# Patient Record
Sex: Female | Born: 1995 | Race: White | Hispanic: No | Marital: Single | Smoking: Never smoker
Health system: Southern US, Community
[De-identification: ages and names within clinical notes are randomized; demographics above are authoritative.]

## PROBLEM LIST (undated history)

## (undated) ENCOUNTER — Ambulatory Visit: Admission: EM | Disposition: A | Discharge status: Home / Self Care | Payer: Managed Care, Other (non HMO)

## (undated) DIAGNOSIS — M24859 Other specific joint derangements of unspecified hip, not elsewhere classified: Secondary | ICD-10-CM

## (undated) DIAGNOSIS — J45909 Unspecified asthma, uncomplicated: Secondary | ICD-10-CM

## (undated) HISTORY — PX: KNEE SURGERY: SHX244

---
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
## 2017-04-02 ENCOUNTER — Ambulatory Visit
Admission: EM | Admit: 2017-04-02 | Discharge: 2017-04-02 | Disposition: A | Discharge status: Home / Self Care | Payer: BLUE CROSS/BLUE SHIELD | Attending: Family Medicine | Admitting: Family Medicine

## 2017-04-02 ENCOUNTER — Encounter: Payer: Self-pay | Admitting: *Deleted

## 2017-04-02 DIAGNOSIS — N390 Urinary tract infection, site not specified: Secondary | ICD-10-CM | POA: Diagnosis not present

## 2017-04-02 HISTORY — DX: Other specific joint derangements of unspecified hip, not elsewhere classified: M24.859

## 2017-04-02 LAB — URINALYSIS, COMPLETE (UACMP) WITH MICROSCOPIC
Bilirubin Urine: NEGATIVE
Glucose, UA: NEGATIVE mg/dL
KETONES UR: NEGATIVE mg/dL
LEUKOCYTES UA: NEGATIVE
Nitrite: NEGATIVE
PROTEIN: NEGATIVE mg/dL
Specific Gravity, Urine: 1.025 (ref 1.005–1.030)
pH: 7 (ref 5.0–8.0)

## 2017-04-02 LAB — PREGNANCY, URINE: Preg Test, Ur: NEGATIVE

## 2017-04-02 MED ORDER — SULFAMETHOXAZOLE-TRIMETHOPRIM 800-160 MG PO TABS: 1.0000 | ORAL_TABLET | Freq: Two times a day (BID) | ORAL | 0 refills | Status: DC

## 2017-04-02 NOTE — ED Triage Notes (Signed)
Patient started having symptoms of nausea, dizziness, body aches and SOB 2 days ago. Patient was given a Cortizone shot in her left kneed 1 week ago.

## 2017-04-02 NOTE — ED Provider Notes (Signed)
[facility]    CSN: [id number] Arrival date & time: 04/02/17  1353     History   Chief Complaint Chief Complaint  Patient presents with  . Dizziness  . Nausea  . Generalized Body Aches    HPI Anna Fox is a 21 y.o. female.   21 yo female with a c/o body aches, nausea, lightheadedness and decreased energy for the past 2-3 days. Denies any fevers, chills, chest pains, cough, congestion, vomiting, diarrhea, dysuria, hematuria.    The history is provided by the patient.  Dizziness    Past Medical History:  Diagnosis Date  . Asthma   . Snapping hip syndrome     There are no active problems to display for this patient.   Past Surgical History:  Procedure Laterality Date  . KNEE SURGERY      OB History    No data available       Home Medications    Prior to Admission medications   Medication Sig Start Date End Date Taking? Authorizing Provider  albuterol (PROVENTIL) (2.5 MG/3ML) 0.083% nebulizer solution Take 2.5 mg by nebulization every 6 (six) hours as needed for wheezing or shortness of breath.   Yes [provider]  norethindrone-ethinyl estradiol-iron (ESTROSTEP FE,TILIA FE,TRI-LEGEST FE) 1-20/1-30/1-35 MG-MCG tablet Take 1 tablet by mouth daily.   Yes [provider]  sulfamethoxazole-trimethoprim (BACTRIM DS,SEPTRA DS) 800-160 MG tablet Take 1 tablet by mouth 2 (two) times daily. 04/02/17   Payton Mccallumonty, Christasia Angeletti, MD    Family History History reviewed. No pertinent family history.  Social History Social History  Substance Use Topics  . Smoking status: Never Smoker  . Smokeless tobacco: Never Used  . Alcohol use No     Allergies   Patient has no known allergies.   Review of Systems Review of Systems  Neurological: Positive for dizziness.     Physical Exam Triage Vital Signs ED Triage Vitals  Enc Vitals Group     BP 04/02/17 1446 127/85     Pulse Rate 04/02/17 1446 93     Resp 04/02/17 1446 16     Temp  04/02/17 1446 99.1 F (37.3 C)     Temp Source 04/02/17 1446 Oral     SpO2 04/02/17 1446 100 %     Weight 04/02/17 1449 162 lb (73.5 kg)     Height 04/02/17 1449 5\' 6"  (1.676 m)     Head Circumference --      Peak Flow --      Pain Score 04/02/17 1449 0     Pain Loc --      Pain Edu? --      Excl. in GC? --    No data found.   Updated Vital Signs BP 127/85 (BP Location: Left Arm)   Pulse 93   Temp 99.1 F (37.3 C) (Oral)   Resp 16   Ht 5\' 6"  (1.676 m)   Wt 162 lb (73.5 kg)   LMP 03/31/2017   SpO2 100%   BMI 26.15 kg/m   Visual Acuity Right Eye Distance:   Left Eye Distance:   Bilateral Distance:    Right Eye Near:   Left Eye Near:    Bilateral Near:     Physical Exam  Constitutional: She appears well-developed and well-nourished. No distress.  HENT:  Head: Normocephalic and atraumatic.  Right Ear: Tympanic membrane, external ear and ear canal normal.  Left Ear: Tympanic membrane, external ear and ear canal normal.  Nose: No  mucosal edema, nose lacerations, sinus tenderness, nasal deformity, septal deviation or nasal septal hematoma. No epistaxis.  No foreign bodies.  Mouth/Throat: Uvula is midline, oropharynx is clear and moist and mucous membranes are normal. No oropharyngeal exudate.  Eyes: Pupils are equal, round, and reactive to light. Conjunctivae and EOM are normal. Right eye exhibits no discharge. Left eye exhibits no discharge. No scleral icterus.  Neck: Normal range of motion. Neck supple. No thyromegaly present.  Cardiovascular: Normal rate, regular rhythm and normal heart sounds.   Pulmonary/Chest: Effort normal and breath sounds normal. No respiratory distress. She has no wheezes. She has no rales.  Abdominal: Soft. Bowel sounds are normal. She exhibits no distension and no mass. There is tenderness (mild, suprapubic). There is no rebound and no guarding. No hernia.  Lymphadenopathy:    She has no cervical adenopathy.  Skin: She is not diaphoretic.    Nursing note and vitals reviewed.    UC Treatments / Results  Labs (all labs ordered are listed, but only abnormal results are displayed) Labs Reviewed  URINALYSIS, COMPLETE (UACMP) WITH MICROSCOPIC - Abnormal; Notable for the following:       Result Value   Hgb urine dipstick TRACE (*)    Squamous Epithelial / LPF 0-5 (*)    Bacteria, UA RARE (*)    All other components within normal limits  PREGNANCY, URINE    EKG  EKG Interpretation None       Radiology No results found.  Procedures Procedures (including critical care time)  Medications Ordered in UC Medications - No data to display   Initial Impression / Assessment and Plan / UC Course  I have reviewed the triage vital signs and the nursing notes.  Pertinent labs & imaging results that were available during my care of the patient were reviewed by me and considered in my medical decision making (see chart for details).      Final Clinical Impressions(s) / UC Diagnoses   Final diagnoses:  Urinary tract infection without hematuria, site unspecified    New Prescriptions Discharge Medication List as of 04/02/2017  4:23 PM    START taking these medications   Details  sulfamethoxazole-trimethoprim (BACTRIM DS,SEPTRA DS) 800-160 MG tablet Take 1 tablet by mouth 2 (two) times daily., Starting Wed 04/02/2017, Normal       1. Lab results and diagnosis reviewed with patient 2. rx as per orders above; reviewed possible side effects, interactions, risks and benefits  3. Recommend supportive treatment with increased fluids 4. Follow-up prn if symptoms worsen or don't improve Controlled Substance Prescriptions [place] Controlled Substance Registry consulted? Not Applicable   Payton Mccallum, MD 04/02/17 1640

## 2017-06-16 ENCOUNTER — Emergency Department (HOSPITAL_COMMUNITY): Payer: BLUE CROSS/BLUE SHIELD

## 2017-06-16 ENCOUNTER — Other Ambulatory Visit: Payer: Self-pay

## 2017-06-16 ENCOUNTER — Emergency Department (HOSPITAL_COMMUNITY)
Admission: EM | Admit: 2017-06-16 | Discharge: 2017-06-16 | Disposition: A | Discharge status: Home / Self Care | Payer: BLUE CROSS/BLUE SHIELD | Attending: Emergency Medicine | Admitting: Emergency Medicine

## 2017-06-16 ENCOUNTER — Encounter (HOSPITAL_COMMUNITY): Payer: Self-pay

## 2017-06-16 DIAGNOSIS — J45901 Unspecified asthma with (acute) exacerbation: Secondary | ICD-10-CM | POA: Diagnosis not present

## 2017-06-16 DIAGNOSIS — Z79899 Other long term (current) drug therapy: Secondary | ICD-10-CM | POA: Diagnosis not present

## 2017-06-16 DIAGNOSIS — R0602 Shortness of breath: Secondary | ICD-10-CM | POA: Diagnosis present

## 2017-06-16 MED ORDER — ALBUTEROL SULFATE (2.5 MG/3ML) 0.083% IN NEBU
5.0000 mg | INHALATION_SOLUTION | Freq: Once | RESPIRATORY_TRACT | Status: AC
  Administered 2017-06-16: 5 mg via RESPIRATORY_TRACT
  Filled 2017-06-16: qty 6

## 2017-06-16 MED ORDER — ALBUTEROL (5 MG/ML) CONTINUOUS INHALATION SOLN
15.0000 mg/h | INHALATION_SOLUTION | RESPIRATORY_TRACT | Status: DC
  Administered 2017-06-16: 15 mg/h via RESPIRATORY_TRACT
  Filled 2017-06-16: qty 3

## 2017-06-16 MED ORDER — PREDNISONE 20 MG PO TABS: ORAL_TABLET | ORAL | 0 refills | Status: DC

## 2017-06-16 MED ORDER — METHYLPREDNISOLONE SODIUM SUCC 125 MG IJ SOLR
125.0000 mg | Freq: Once | INTRAMUSCULAR | Status: AC
  Administered 2017-06-16: 125 mg via INTRAMUSCULAR
  Filled 2017-06-16: qty 2

## 2017-06-16 NOTE — ED Notes (Signed)
Patient signed discharge paperwork, will continue to monitor patient HR until it lowers

## 2017-06-16 NOTE — ED Triage Notes (Signed)
Per Pt, Pt is coming from home with complaints of asthma attack. Wheezing noted.

## 2017-06-16 NOTE — ED Notes (Signed)
Pt states chest still feels tight, though sob improved.

## 2017-06-16 NOTE — ED Provider Notes (Signed)
[facility]   CSN: [id number] Arrival date & time: 06/16/17  1108     History   Chief Complaint Chief Complaint  Patient presents with  . Asthma    HPI Anna Fox is a 21 y.o. female.  HPI Patient with history of asthma presents with several days of gradually increasing shortness of breath.  Associated with wheezing and nonproductive cough.  Has been taking medications as prescribed with little relief.  Denies any fever or chills.  No chest pain.  No new lower extremity swelling or pain. Past Medical History:  Diagnosis Date  . Asthma   . Snapping hip syndrome     There are no active problems to display for this patient.   Past Surgical History:  Procedure Laterality Date  . KNEE SURGERY      OB History    No data available       Home Medications    Prior to Admission medications   Medication Sig Start Date End Date Taking? Authorizing Provider  albuterol (PROAIR HFA) 108 (90 Base) MCG/ACT inhaler Inhale 2 puffs into the lungs every 6 (six) hours as needed for wheezing. 01/09/17  Yes [provider]  beclomethasone (QVAR) 40 MCG/ACT inhaler Inhale 2 puffs into the lungs 2 (two) times daily. 04/15/17  Yes [provider]  fluticasone (FLONASE) 50 MCG/ACT nasal spray Place 2 sprays into the nose daily as needed for allergies. 04/28/17 04/28/18 Yes [provider]  Norethindrone Acetate-Ethinyl Estradiol (JUNEL 1.5/30) 1.5-30 MG-MCG tablet Take 1 tablet by mouth daily. 01/09/17  Yes [provider]  norethindrone-ethinyl estradiol-iron (ESTROSTEP FE,TILIA FE,TRI-LEGEST FE) 1-20/1-30/1-35 MG-MCG tablet Take 1 tablet by mouth daily.   Yes [provider]  predniSONE (DELTASONE) 20 MG tablet 3 tabs po day one, then 2 po daily x 4 days 06/17/17   Loren RacerYelverton, Dorena Dorfman, MD  sulfamethoxazole-trimethoprim (BACTRIM DS,SEPTRA DS) 800-160 MG tablet Take 1 tablet by mouth 2 (two) times  daily. Patient not taking: Reported on 06/16/2017 04/02/17   Payton Mccallumonty, [place], MD    Family History No family history on file.  Social History Social History   Tobacco Use  . Smoking status: Never Smoker  . Smokeless tobacco: Never Used  Substance Use Topics  . Alcohol use: No  . Drug use: No     Allergies   Patient has no known allergies.   Review of Systems Review of Systems  Constitutional: Negative for chills and fever.  HENT: Negative for congestion, sinus pressure, sinus pain and sore throat.   Respiratory: Positive for cough, shortness of breath and wheezing.   Cardiovascular: Negative for chest pain, palpitations and leg swelling.  Gastrointestinal: Negative for abdominal pain, diarrhea, nausea and vomiting.  Musculoskeletal: Negative for back pain, myalgias, neck pain and neck stiffness.  Skin: Negative for rash and wound.  Neurological: Negative for dizziness, weakness, light-headedness, numbness and headaches.  All other systems reviewed and are negative.    Physical Exam Updated Vital Signs BP 121/66   Pulse (!) 116   Temp 98.1 F (36.7 C) (Oral)   Resp 11   LMP 06/16/2017   SpO2 98%   Physical Exam  Constitutional: She is oriented to person, place, and time. She appears well-developed and well-nourished. No distress.  HENT:  Head: Normocephalic and atraumatic.  Mouth/Throat: Oropharynx is clear and moist.  Eyes: EOM are normal. Pupils are equal, round, and reactive to light.  Neck: Normal range of motion. Neck supple.  Cardiovascular:  Normal rate and regular rhythm.  Pulmonary/Chest: Effort normal. She has wheezes.  Decreased air movement.  Inspiratory and expiratory wheezing throughout.  Abdominal: Soft. Bowel sounds are normal. There is no tenderness. There is no rebound and no guarding.  Musculoskeletal: Normal range of motion. She exhibits no edema or tenderness.  No lower extremity swelling or asymmetry.   Neurological: She is alert and  oriented to person, place, and time.  Moves all extremities without deficit.  Sensation fully intact.  Skin: Skin is warm and dry. Capillary refill takes less than 2 seconds. No rash noted. She is not diaphoretic. No erythema.  Psychiatric: She has a normal mood and affect. Her behavior is normal.  Nursing note and vitals reviewed.    ED Treatments / Results  Labs (all labs ordered are listed, but only abnormal results are displayed) Labs Reviewed - No data to display  EKG  EKG Interpretation None       Radiology Dg Chest 2 View  Result Date: 06/16/2017 CLINICAL DATA:  Shortness of breath. EXAM: CHEST  2 VIEW COMPARISON:  Chest x-ray dated January 11, 2015. FINDINGS: The cardiomediastinal silhouette is normal in size. Normal pulmonary vascularity. No focal consolidation, pleural effusion, or pneumothorax. No acute osseous abnormality. IMPRESSION: No active cardiopulmonary disease. Electronically Signed   By: Obie DredgeWilliam T Derry M.D.   On: 06/16/2017 12:09    Procedures Procedures (including critical care time)  Medications Ordered in ED Medications  albuterol (PROVENTIL,VENTOLIN) solution continuous neb (15 mg/hr Nebulization New Bag/Given 06/16/17 1622)  albuterol (PROVENTIL) (2.5 MG/3ML) 0.083% nebulizer solution 5 mg (5 mg Nebulization Given 06/16/17 1145)  methylPREDNISolone sodium succinate (SOLU-MEDROL) 125 mg/2 mL injection 125 mg (125 mg Intramuscular Given 06/16/17 1615)     Initial Impression / Assessment and Plan / ED Course  I have reviewed the triage vital signs and the nursing notes.  Pertinent labs & imaging results that were available during my care of the patient were reviewed by me and considered in my medical decision making (see chart for details).    Wheezing significantly improved after continuous neb.  Patient had brief tachycardia which is improved.  States she is feeling much better.  Will discharge home with short course of prednisone and follow-up with  primary doctor.  Return precautions given.   Final Clinical Impressions(s) / ED Diagnoses   Final diagnoses:  Exacerbation of asthma, unspecified asthma severity, unspecified whether persistent    ED Discharge Orders        Ordered    predniSONE (DELTASONE) 20 MG tablet     06/16/17 Charlene Brooke1838       Kashawn Manzano, MD 06/16/17 1839

## 2017-09-24 ENCOUNTER — Ambulatory Visit (HOSPITAL_COMMUNITY)
Admission: EM | Admit: 2017-09-24 | Discharge: 2017-09-24 | Disposition: A | Discharge status: Home / Self Care | Payer: BLUE CROSS/BLUE SHIELD | Attending: Family Medicine | Admitting: Family Medicine

## 2017-09-24 ENCOUNTER — Encounter (HOSPITAL_COMMUNITY): Payer: Self-pay | Admitting: Family Medicine

## 2017-09-24 DIAGNOSIS — J020 Streptococcal pharyngitis: Secondary | ICD-10-CM

## 2017-09-24 MED ORDER — ACETAMINOPHEN 325 MG PO TABS
650.0000 mg | ORAL_TABLET | Freq: Once | ORAL | Status: AC
  Administered 2017-09-24: 650 mg via ORAL

## 2017-09-24 MED ORDER — ACETAMINOPHEN 325 MG PO TABS
ORAL_TABLET | ORAL | Status: AC
  Filled 2017-09-24: qty 2

## 2017-09-24 MED ORDER — AMOXICILLIN 875 MG PO TABS: 875.0000 mg | ORAL_TABLET | Freq: Two times a day (BID) | ORAL | 0 refills | Status: AC

## 2017-09-24 NOTE — ED Triage Notes (Signed)
Pt here with sore throat, fever and body aches

## 2017-09-25 NOTE — ED Provider Notes (Signed)
  [facility]   [id number] 09/24/17 Arrival Time: 1951  ASSESSMENT & PLAN:  1. Streptococcal sore throat    Meds ordered this encounter  Medications  . acetaminophen (TYLENOL) tablet 650 mg  . amoxicillin (AMOXIL) 875 MG tablet    Sig: Take 1 tablet (875 mg total) by mouth 2 (two) times daily for 10 days.    Dispense:  20 tablet    Refill:  0   OTC analgesics and throat care as needed  Instructed to finish full 10 day course of antibiotics. Will follow up if not showing significant improvement over the next 24-48 hours.  Reviewed expectations re: course of current medical issues. Questions answered. Outlined signs and symptoms indicating need for more acute intervention. Patient verbalized understanding. After Visit Summary given.   SUBJECTIVE:  Anna Fox is a 22 y.o. female who reports a sore throat. Describes as pain with swallowing. Onset abrupt beginning 1 day ago. No respiratory symptoms. Normal PO intake but reports discomfort with swallowing. Fever reported: yes. No associated n/v/abdominal symptoms. Sick contacts: none.  No neck pain or swelling. No rashes.  OTC treatment: Tylenol with mild help.  ROS: As per HPI.   OBJECTIVE:  Vitals:   09/24/17 2008  BP: 115/61  Pulse: (!) 141  Resp: 18  Temp: (!) 103.1 F (39.5 C)  TempSrc: Oral  SpO2: 98%     General appearance: alert; no distress HEENT: throat with tonsillar hypertrophy, marked erythema and exudates present; uvula midline Neck: supple with FROM; bilateral cervical LAD, tender Lungs: clear to auscultation bilaterally Skin: reveals no rash; warm and dry Psychological: alert and cooperative; normal mood and affect  No Known Allergies  Past Medical History:  Diagnosis Date  . Asthma   . Snapping hip syndrome    Social History   Socioeconomic History  . Marital status: Single    Spouse name: Not on file  . Number of children: Not on file  . Years of education: Not on file  .  Highest education level: Not on file  Social Needs  . Financial resource strain: Not on file  . Food insecurity - worry: Not on file  . Food insecurity - inability: Not on file  . Transportation needs - medical: Not on file  . Transportation needs - non-medical: Not on file  Occupational History  . Not on file  Tobacco Use  . Smoking status: Never Smoker  . Smokeless tobacco: Never Used  Substance and Sexual Activity  . Alcohol use: No  . Drug use: No  . Sexual activity: Not on file  Other Topics Concern  . Not on file  Social History Narrative  . Not on file          Mardella LaymanHagler, Doreene Forrey, MD 09/25/17 [phone]

## 2018-04-14 ENCOUNTER — Observation Stay (HOSPITAL_COMMUNITY)
Admission: EM | Admit: 2018-04-14 | Discharge: 2018-04-16 | Disposition: A | Discharge status: Home / Self Care | Payer: BLUE CROSS/BLUE SHIELD | Attending: Internal Medicine | Admitting: Internal Medicine

## 2018-04-14 ENCOUNTER — Encounter (HOSPITAL_COMMUNITY): Payer: Self-pay | Admitting: Emergency Medicine

## 2018-04-14 ENCOUNTER — Other Ambulatory Visit: Payer: Self-pay

## 2018-04-14 DIAGNOSIS — R269 Unspecified abnormalities of gait and mobility: Secondary | ICD-10-CM | POA: Diagnosis present

## 2018-04-14 DIAGNOSIS — M542 Cervicalgia: Secondary | ICD-10-CM | POA: Diagnosis present

## 2018-04-14 DIAGNOSIS — J4540 Moderate persistent asthma, uncomplicated: Secondary | ICD-10-CM | POA: Diagnosis present

## 2018-04-14 DIAGNOSIS — R482 Apraxia: Principal | ICD-10-CM

## 2018-04-14 DIAGNOSIS — R2689 Other abnormalities of gait and mobility: Secondary | ICD-10-CM | POA: Diagnosis not present

## 2018-04-14 DIAGNOSIS — M24859 Other specific joint derangements of unspecified hip, not elsewhere classified: Secondary | ICD-10-CM | POA: Insufficient documentation

## 2018-04-14 DIAGNOSIS — T1490XA Injury, unspecified, initial encounter: Secondary | ICD-10-CM

## 2018-04-14 DIAGNOSIS — Z79899 Other long term (current) drug therapy: Secondary | ICD-10-CM | POA: Diagnosis not present

## 2018-04-14 DIAGNOSIS — F444 Conversion disorder with motor symptom or deficit: Secondary | ICD-10-CM

## 2018-04-14 NOTE — ED Notes (Signed)
Pt back in WR

## 2018-04-14 NOTE — ED Triage Notes (Signed)
Pt reports left neck pain that started today at softball practice. Pt reports she was pitching and jerked her head back, heard a click and now it hurts to move.
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
## 2018-04-15 ENCOUNTER — Observation Stay (HOSPITAL_COMMUNITY): Payer: BLUE CROSS/BLUE SHIELD

## 2018-04-15 ENCOUNTER — Other Ambulatory Visit: Payer: Self-pay

## 2018-04-15 ENCOUNTER — Emergency Department (HOSPITAL_COMMUNITY): Payer: BLUE CROSS/BLUE SHIELD

## 2018-04-15 ENCOUNTER — Encounter (HOSPITAL_COMMUNITY): Payer: Self-pay | Admitting: Internal Medicine

## 2018-04-15 DIAGNOSIS — R270 Ataxia, unspecified: Secondary | ICD-10-CM | POA: Diagnosis not present

## 2018-04-15 DIAGNOSIS — M542 Cervicalgia: Secondary | ICD-10-CM | POA: Diagnosis present

## 2018-04-15 DIAGNOSIS — J4540 Moderate persistent asthma, uncomplicated: Secondary | ICD-10-CM | POA: Diagnosis present

## 2018-04-15 DIAGNOSIS — R260 Ataxic gait: Secondary | ICD-10-CM

## 2018-04-15 LAB — COMPREHENSIVE METABOLIC PANEL
ALBUMIN: 4.3 g/dL (ref 3.5–5.0)
ALT: 29 U/L (ref 0–44)
AST: 27 U/L (ref 15–41)
Alkaline Phosphatase: 70 U/L (ref 38–126)
Anion gap: 9 (ref 5–15)
BUN: 16 mg/dL (ref 6–20)
CHLORIDE: 107 mmol/L (ref 98–111)
CO2: 25 mmol/L (ref 22–32)
Calcium: 9.2 mg/dL (ref 8.9–10.3)
Creatinine, Ser: 0.97 mg/dL (ref 0.44–1.00)
GFR calc Af Amer: >60 mL/min (ref >60)
GFR calc non Af Amer: >60 mL/min (ref >60)
Glucose, Bld: 97 mg/dL (ref 70–99)
POTASSIUM: 3.2 mmol/L — AB (ref 3.5–5.1)
SODIUM: 141 mmol/L (ref 135–145)
Total Bilirubin: 0.6 mg/dL (ref 0.3–1.2)
Total Protein: 7.1 g/dL (ref 6.5–8.1)

## 2018-04-15 LAB — DIFFERENTIAL
ABS IMMATURE GRANULOCYTES: 0 10*3/uL (ref 0.0–0.1)
BASOS ABS: 0 10*3/uL (ref 0.0–0.1)
BASOS PCT: 0 %
Eosinophils Absolute: 0.3 10*3/uL (ref 0.0–0.7)
Eosinophils Relative: 3 %
IMMATURE GRANULOCYTES: 0 %
Lymphocytes Relative: 42 %
Lymphs Abs: 3.6 10*3/uL (ref 0.7–4.0)
Monocytes Absolute: 0.6 10*3/uL (ref 0.1–1.0)
Monocytes Relative: 7 %
NEUTROS ABS: 4.1 10*3/uL (ref 1.7–7.7)
Neutrophils Relative %: 48 %

## 2018-04-15 LAB — CBC
HCT: 40.4 % (ref 36.0–46.0)
HEMOGLOBIN: 13.1 g/dL (ref 12.0–15.0)
MCH: 28.2 pg (ref 26.0–34.0)
MCHC: 32.4 g/dL (ref 30.0–36.0)
MCV: 87.1 fL (ref 78.0–100.0)
PLATELETS: 274 10*3/uL (ref 150–400)
RBC: 4.64 MIL/uL (ref 3.87–5.11)
RDW: 12.6 % (ref 11.5–15.5)
WBC: 8.5 10*3/uL (ref 4.0–10.5)

## 2018-04-15 LAB — RAPID URINE DRUG SCREEN, HOSP PERFORMED
Amphetamines: NOT DETECTED
BENZODIAZEPINES: NOT DETECTED
Barbiturates: NOT DETECTED
Cocaine: NOT DETECTED
Opiates: NOT DETECTED
Tetrahydrocannabinol: NOT DETECTED

## 2018-04-15 LAB — APTT: APTT: 30 s (ref 24–36)

## 2018-04-15 LAB — PROTIME-INR
INR: 1
Prothrombin Time: 13.1 seconds (ref 11.4–15.2)

## 2018-04-15 MED ORDER — GADOBUTROL 1 MMOL/ML IV SOLN
7.5000 mL | Freq: Once | INTRAVENOUS | Status: AC | PRN
  Administered 2018-04-15: 7 mL via INTRAVENOUS

## 2018-04-15 MED ORDER — ACETAMINOPHEN 650 MG RE SUPP: 650.0000 mg | Freq: Four times a day (QID) | RECTAL | Status: DC | PRN

## 2018-04-15 MED ORDER — ACETAMINOPHEN 325 MG PO TABS: 650.0000 mg | ORAL_TABLET | Freq: Four times a day (QID) | ORAL | Status: DC | PRN

## 2018-04-15 MED ORDER — ONDANSETRON HCL 4 MG PO TABS: 4.0000 mg | ORAL_TABLET | Freq: Four times a day (QID) | ORAL | Status: DC | PRN

## 2018-04-15 MED ORDER — IOPAMIDOL (ISOVUE-370) INJECTION 76%
INTRAVENOUS | Status: AC
  Filled 2018-04-15: qty 50

## 2018-04-15 MED ORDER — METHOCARBAMOL 500 MG PO TABS: 1000.0000 mg | ORAL_TABLET | Freq: Four times a day (QID) | ORAL | Status: DC | PRN

## 2018-04-15 MED ORDER — ALBUTEROL SULFATE (2.5 MG/3ML) 0.083% IN NEBU: 2.5000 mg | INHALATION_SOLUTION | Freq: Four times a day (QID) | RESPIRATORY_TRACT | Status: DC | PRN

## 2018-04-15 MED ORDER — IOPAMIDOL (ISOVUE-370) INJECTION 76%
50.0000 mL | Freq: Once | INTRAVENOUS | Status: AC | PRN
  Administered 2018-04-15: 50 mL via INTRAVENOUS

## 2018-04-15 MED ORDER — MOMETASONE FURO-FORMOTEROL FUM 200-5 MCG/ACT IN AERO
2.0000 | INHALATION_SPRAY | Freq: Two times a day (BID) | RESPIRATORY_TRACT | Status: DC
  Administered 2018-04-15 – 2018-04-16 (×2): 2 via RESPIRATORY_TRACT
  Filled 2018-04-15: qty 8.8

## 2018-04-15 MED ORDER — NORETHINDRONE ACET-ETHINYL EST 1.5-30 MG-MCG PO TABS: 1.0000 | ORAL_TABLET | Freq: Every day | ORAL | Status: DC

## 2018-04-15 MED ORDER — ONDANSETRON HCL 4 MG/2ML IJ SOLN: 4.0000 mg | Freq: Four times a day (QID) | INTRAMUSCULAR | Status: DC | PRN

## 2018-04-15 NOTE — Progress Notes (Signed)
Patient sleeping with no distress, respirations unlabored , family  with pt.

## 2018-04-15 NOTE — Progress Notes (Signed)
Pt requesting update. Dr. Ophelia Charter contacted and she states pt to be observed overnight and potential discharge in am. Same reported to patient.Pt states pain 5/10 at neck but declines pain medication.father and brother at bedside

## 2018-04-15 NOTE — ED Notes (Signed)
Patient transported to CT 

## 2018-04-15 NOTE — ED Notes (Signed)
Family at bedside. 

## 2018-04-15 NOTE — Consult Note (Addendum)
Neurology Consultation  Reason for Consult: Left neck pain, gait ataxia following pitching injury Referring Physician: Dr. Manus Gunning  CC: Neck pain, gait ataxia  History is obtained from: Patient, chart  HPI: Anna Fox is a 22 y.o. female with past medical history of asthma, who was in her usual state of health till about 4 PM on 04/14/2018 when she was at her school softball practice and threw a fast pitch following which her head flipped back in a swinging motion and she heard a crack in her neck.  She was able to walk back to the dugout and noticed after that that she has extreme difficulty moving her neck looking to the left as well as upon attempting to walk, was very unsteady and unstable. She denies any lightheadedness or dizziness at rest but on attempting to walk, she feels dizzy which she describes as unstable and attempting to walk. Reports no focal tingling, numbness or weakness. Denies any similar episodes in the past. Due to the abnormal neurological exam-ataxia on attempting to walk, and neck pain, that started after neck injury, concern for vertebral artery dissection, neurological consultation was placed. I saw the patient in the emergency room fast-track room and have ordered a stat CT Angie of head and neck.  LKW: 4 PM on 04/14/2018 tpa given?: no, outside the window Premorbid modified Rankin scale (mRS):0  ROS: ROS was performed and is negative except as noted in the HPI.  Past Medical History:  Diagnosis Date  . Asthma   . Snapping hip syndrome     No family history on file.  Social History:   reports that she has never smoked. She has never used smokeless tobacco. She reports that she does not drink alcohol or use drugs.  Medications No current facility-administered medications for this encounter.   Current Outpatient Medications:  .  albuterol (PROAIR HFA) 108 (90 Base) MCG/ACT inhaler, Inhale 2 puffs into the lungs every 6 (six) hours as needed for  wheezing., Disp: , Rfl:  .  beclomethasone (QVAR) 40 MCG/ACT inhaler, Inhale 2 puffs into the lungs 2 (two) times daily., Disp: , Rfl:  .  fluticasone (FLONASE) 50 MCG/ACT nasal spray, Place 2 sprays into the nose daily as needed for allergies., Disp: , Rfl:  .  Norethindrone Acetate-Ethinyl Estradiol (JUNEL 1.5/30) 1.5-30 MG-MCG tablet, Take 1 tablet by mouth daily., Disp: , Rfl:  .  norethindrone-ethinyl estradiol-iron (ESTROSTEP FE,TILIA FE,TRI-LEGEST FE) 1-20/1-30/1-35 MG-MCG tablet, Take 1 tablet by mouth daily., Disp: , Rfl:  .  predniSONE (DELTASONE) 20 MG tablet, 3 tabs po day one, then 2 po daily x 4 days, Disp: 11 tablet, Rfl: 0 .  sulfamethoxazole-trimethoprim (BACTRIM DS,SEPTRA DS) 800-160 MG tablet, Take 1 tablet by mouth 2 (two) times daily. (Patient not taking: Reported on 06/16/2017), Disp: 10 tablet, Rfl: 0  Exam: Current vital signs: BP 129/83 (BP Location: Left Arm)   Pulse 84   Temp 98.2 F (36.8 C) (Oral)   Resp 16   Ht 5' 5.5" (1.664 m)   Wt 70.3 kg   LMP 04/13/2018   SpO2 100%   BMI 25.40 kg/m  Vital signs in last 24 hours: Temp:  [98.2 F (36.8 C)] 98.2 F (36.8 C) (09/24 2050) Pulse Rate:  [84] 84 (09/24 2050) Resp:  [16] 16 (09/24 2050) BP: (129)/(83) 129/83 (09/24 2050) SpO2:  [100 %] 100 % (09/24 2050) Weight:  [70.3 kg] 70.3 kg (09/24 2051) GENERAL: Awake, alert in NAD but anxious appearing HEENT: - Normocephalic and  atraumatic, dry mm, no LN++, no Thyromegaly. Palpable tenderness in the left neck. LUNGS - Clear to auscultation bilaterally with no wheezes CV - S1S2 RRR, no m/r/g, equal pulses bilaterally. ABDOMEN - Soft, nontender, nondistended with normoactive BS Ext: warm, well perfused, intact peripheral pulses, no edema  NEURO:  Mental Status: AA&Ox3 Language: speech is clear.  Naming, repetition, fluency, and comprehension intact. Cranial Nerves: PERRL_ EOMI, visual fields full, no facial asymmetry, facial sensation intact, hearing intact,  tongue/uvula/soft palate midline, normal sternocleidomastoid and trapezius muscle strength. No evidence of tongue atrophy or fibrillations Motor: 5/5 in all 4 extremities with no drift Tone: is normal and bulk is normal Sensation- Intact to light touch bilaterally Coordination: FTN intact bilaterally, no ataxia in BLE. Gait-upon getting up from the seated position, patient becomes wobbly as if her knee is about to buckle and is able to take a few steps again with wobbly legs although it rest, she had 5/5 strength in both hip flexors extensors as well as knee flexors and extensors.  NIHSS-0   Labs I have reviewed labs in epic and the results pertinent to this consultation are:  CBC    Component Value Date/Time   WBC 6.7 09/01/2016 1807   RBC 4.53 09/01/2016 1807   HGB 12.8 09/01/2016 1807   HCT 38.6 09/01/2016 1807   PLT 268 09/01/2016 1807   MCV 85.2 09/01/2016 1807   MCH 28.3 09/01/2016 1807   MCHC 33.2 09/01/2016 1807   RDW 13.1 09/01/2016 1807    CMP     Component Value Date/Time   NA 141 09/01/2016 1807   K 3.9 09/01/2016 1807   CL 107 09/01/2016 1807   CO2 28 09/01/2016 1807   GLUCOSE 95 09/01/2016 1807   BUN 11 09/01/2016 1807   CREATININE 0.76 09/01/2016 1807   CALCIUM 9.0 09/01/2016 1807   PROT 6.5 09/01/2016 1807   ALBUMIN 3.9 09/01/2016 1807   AST 21 09/01/2016 1807   ALT 18 09/01/2016 1807   ALKPHOS 64 09/01/2016 1807   BILITOT 0.3 09/01/2016 1807   GFRNONAA >60 09/01/2016 1807   GFRAA >60 09/01/2016 1807  U tox - negative  Imaging I have reviewed the images obtained: CTA head and neck-no dissection, no abnormality on CT head non con and no fracture on CT c-spine.  Assessment:  22/F with no significant PMH, with sudden onset of gait instability without any other evidence of ataxia on exam, which started after she threw a fast pitch and cracked her neck. CTH unremarkable for acute process including early sign of ischemic stroke or bleed. CTA head neck  negative for dissection. Unclear etiology of the gait abnormality. Would recommend MR imaging brain and C-spine to ensure that there is no acute stroke or evidence of c-spine or cervical cord trauma. In the absence of MRI abnormality, I do not have a good plausible explanation for the kind of gait abnormality that she is exhibiting.  Impression: Acute onset of gait unsteadiness after almost whiplash like mechanism injury to the neck No CT/CTA head neck abnormality Evaluate further with head and neck MRI  Recommendations: MRI brain and C-spine without contrast PT OT eval No other neurological testing needed if MRI is negative. Will follow after MRI is done.  -- Milon Dikes, MD [facility] Pager: [phone] If 7pm to 7am, please call on call as listed on [facility]ION.  ADDENDUM Pt completed MRI brain and C-spine. No acute abnormalities. No explanation for the symptoms. Will need PT OT.  -- Beatrix Fetters  Wilford Corner, MD [facility] Pager: [phone] If 7pm to 7am, please call on call as listed on AMION.

## 2018-04-15 NOTE — Progress Notes (Signed)
Pt states pain continues and is eating supper while watching TV with family stats she will take mediation now.

## 2018-04-15 NOTE — ED Notes (Signed)
Attempted to ambulate patient down hallway. However, due to Pt's obvious shakiness and jerky movements, this tech did not feel comfortable ambulating the patient out of the room. Pt stated she did not feel weak, "just shaky". RN notified.

## 2018-04-15 NOTE — Progress Notes (Addendum)
Subjective: Patient states that she is feeling better today.  That her gait is slightly more stable.  Friends in the room feels as though she is getting better.  Patient stating that her upper body has no problems but her lower body and walking seems to be difficult.  She admits that she has full strength.  She did describe left medial cervical neck pain that was exacerbated by palpation.  Of note: Patient made minimal eye contact during follow-up  Exam: Vitals:   04/15/18 0154 04/15/18 0621  BP: 137/83 132/77  Pulse: 68 60  Resp: 18 18  Temp:    SpO2: 99% 100%    Physical Exam   HEENT-  Normocephalic, no lesions, without obvious abnormality.  Normal external eye and conjunctiva.   Extremities- Warm, dry and intact Musculoskeletal-no joint tenderness, deformity or swelling Skin-warm and dry, no hyperpigmentation, vitiligo, or suspicious lesions    Neuro:  Mental Status: Alert, oriented, thought content appropriate.  Speech fluent without evidence of aphasia.  Able to follow 3 step commands without difficulty.  Had decreased eye contact during the interview.   Cranial Nerves: II:  Visual fields grossly normal,  III,IV, VI: ptosis not present, extra-ocular motions intact bilaterally pupils equal, round, reactive to light and accommodation V,VII: smile symmetric, facial light touch sensation normal bilaterally VIII: hearing normal bilaterally IX,X: uvula rises midline XI: bilateral shoulder shrug XII: midline tongue extension Motor: Right : Upper extremity   5/5    Left:     Upper extremity   5/5  Lower extremity   5/5     Lower extremity   5/5 Tone and bulk:normal tone throughout; no atrophy noted Sensory: Pinprick and light touch intact throughout, bilaterally, proprioception, vibratory sensation temperature sensation and touch were all intact Deep Tendon Reflexes: 2+ and symmetric throughout Plantars: Right: downgoing   Left: downgoing Cerebellar: normal finger-to-nose,and  normal heel-to-shin test Gait: Patient was able to stand on her own, was able to bend her knees and then stand on her own, she did have a stuttering gait but was able to still walk on her toes, walk on her heels, walk with tandem gait however had some difficulty but did not fall.    Medications:  Scheduled: . mometasone-formoterol  2 puff Inhalation BID  . Norethindrone Acetate-Ethinyl Estradiol  1 tablet Oral Daily      Pertinent Labs/Diagnostics: None  Ct Angio Head W Or Wo Contrast   IMPRESSION: CT HEAD IMPRESSION Normal head CT.  No acute intracranial abnormality identified. CTA HEAD AND NECK IMPRESSION Normal CTA of the head and neck. No evidence for arterial dissection or other acute vascular abnormality. No large vessel occlusion. CT CERVICAL SPINE IMPRESSION No CT evidence for acute fracture or other osseous abnormality within the cervical spine. Electronically Signed   By: Rise Mu M.D.   On: 04/15/2018 02:39   Ct Angio Neck W And/or Wo Contrast  Result Date: 04/15/2018 IMPRESSION: CT HEAD IMPRESSION Normal head CT.  No acute intracranial abnormality identified. CTA HEAD AND NECK IMPRESSION Normal CTA of the head and neck. No evidence for arterial dissection or other acute vascular abnormality. No large vessel occlusion. CT CERVICAL SPINE IMPRESSION No CT evidence for acute fracture or other osseous abnormality within the cervical spine. Electronically Signed   By: Rise Mu M.D.   On: 04/15/2018 02:39   Mr Brain Wo Contrast  Result Date: 04/15/2018  IMPRESSION: Normal brain MRI.  No acute intracranial abnormality identified. Electronically Signed   By: Sharlet Salina  Phill MyronMcClintock M.D.   On: 04/15/2018 06:10   Mr Thoracic Spine Wo Contrast  Result Date: 04/15/2018  IMPRESSION: Negative exam.  No cause is seen for the reported symptoms. Electronically Signed   By: Elsie StainJohn T Curnes M.D.   On: 04/15/2018 11:29   Mr Cervical Spine W Or Wo Contrast  Result Date:  04/15/2018  IMPRESSION: Normal MRI of the cervical spine. No acute traumatic injury identified. No significant disc pathology, stenosis, or evidence for neural impingement. Electronically Signed   By: Rise MuBenjamin  McClintock M.D.   On: 04/15/2018 06:19   Ct C-spine No Charge   CTA HEAD AND NECK IMPRESSION Normal CTA of the head and neck. No evidence for arterial dissection or other acute vascular abnormality. No large vessel occlusion. CT CERVICAL SPINE IMPRESSION No CT evidence for acute fracture or other osseous abnormality within the cervical spine. Electronically Signed   By: Rise MuBenjamin  McClintock M.D.   On: 04/15/2018 02:39     Felicie MornDavid Smith PA-C [facility] [phone]   Assessment/Recommendations:  Acute onset of gait unsteadiness after whiplash-like mechanism to her neck.  At this point neuraxis has been fully evaluated with imaging showing no abnormalities of brain, cervical spine or thoracic spine along with CTA of head and neck.  Exam shows full strength. There is gait instability, but it is more consistent with astasia-abasia requiring normal fine motor control and strength rather than an ataxia, disequilibrium or due to weakness. It is noted that the patient steadies herself and after extended observation, appears quite unsteady but never to the point of falling or appearing as though she is about to fall. Poor eye contact is noted.  Given the fact that there are no hard neurological findings on exam, but in the setting of prominent to-and-fro movements with subjective unsteadiness on standing/walking, the patient will benefit from inpatient physical therapy.  It has been recommended that the patient take a week off of her sport in order to give her time to recover.  Patient and family expressed understanding and had no further questions.     Electronically signed: Dr. Caryl PinaEric Lurlie Wigen 04/15/2018, 12:11 PM

## 2018-04-15 NOTE — ED Notes (Signed)
Pt transported to MRI 

## 2018-04-15 NOTE — ED Provider Notes (Signed)
[facility] EMERGENCY DEPARTMENT Provider Note   CSN: [id number] Arrival date & time: 04/14/18  2027     History   Chief Complaint Chief Complaint  Patient presents with  . Neck Pain    HPI Anna Fox is a 22 y.o. female.  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 While in a pitching motion, she flung her head back. She heard and felt a click in her neck. She suddenly developed left lateral neck pain. She was able to ambulate off the field without difficulty. Shortly thereafter, patient developed an ataxic gait, and had to be assisted by her teammates to walk. She has a mild headache. No visual disturbance. No dizziness. No upper extremity weakness.     Past Medical History:  Diagnosis Date  . Asthma   . Snapping hip syndrome     There are no active problems to display for this patient.   Past Surgical History:  Procedure Laterality Date  . KNEE SURGERY       OB History   None      Home Medications    Prior to Admission medications   Medication Sig Start Date End Date Taking? Authorizing Provider  albuterol (PROAIR HFA) 108 (90 Base) MCG/ACT inhaler Inhale 2 puffs into the lungs every 6 (six) hours as needed for wheezing. 01/09/17   [provider]  beclomethasone (QVAR) 40 MCG/ACT inhaler Inhale 2 puffs into the lungs 2 (two) times daily. 04/15/17   [provider]  fluticasone (FLONASE) 50 MCG/ACT nasal spray Place 2 sprays into the nose daily as needed for allergies. 04/28/17 04/28/18  [provider]  Norethindrone Acetate-Ethinyl Estradiol (JUNEL 1.5/30) 1.5-30 MG-MCG tablet Take 1 tablet by mouth daily. 01/09/17   [provider]  norethindrone-ethinyl estradiol-iron (ESTROSTEP FE,TILIA FE,TRI-LEGEST FE) 1-20/1-30/1-35 MG-MCG tablet Take 1 tablet by mouth daily.    [provider]  predniSONE (DELTASONE) 20 MG tablet 3 tabs po day one, then 2 po daily x 4 days 06/17/17   Loren Racer, MD   sulfamethoxazole-trimethoprim (BACTRIM DS,SEPTRA DS) 800-160 MG tablet Take 1 tablet by mouth 2 (two) times daily. Patient not taking: Reported on 06/16/2017 04/02/17   Payton Mccallum, MD    Family History No family history on file.  Social History Social History   Tobacco Use  . Smoking status: Never Smoker  . Smokeless tobacco: Never Used  Substance Use Topics  . Alcohol use: No  . Drug use: No     Allergies   Patient has no known allergies.   Review of Systems Review of Systems  Musculoskeletal: Positive for gait problem, neck pain and neck stiffness.  All other systems reviewed and are negative.    Physical Exam Updated Vital Signs BP 129/83 (BP Location: Left Arm)   Pulse 84   Temp 98.2 F (36.8 C) (Oral)   Resp 16   Ht 5' 5.5" (1.664 m)   Wt 70.3 kg   LMP 04/13/2018   SpO2 100%   BMI 25.40 kg/m   Physical Exam  Constitutional: She is oriented to person, place, and time. She appears well-developed and well-nourished. No distress.  HENT:  Head: Atraumatic.  Eyes: Conjunctivae and EOM are normal.  Neck: Neck supple.  Cardiovascular: Normal rate and regular rhythm.  Pulmonary/Chest: Effort normal and breath sounds normal.  Abdominal: Soft. Bowel sounds are normal.  Neurological: She is alert and oriented to person, place, and time. She has normal strength. No cranial nerve deficit or sensory  deficit. Coordination normal. GCS eye subscore is 4. GCS verbal subscore is 5. GCS motor subscore is 6.  Normal upper extremity strength. Normal finger to nose. Very unsteady when standing and attempting to ambulate.  Skin: Skin is warm and dry.  Psychiatric: She has a normal mood and affect.  Nursing note and vitals reviewed.    ED Treatments / Results  Labs (all labs ordered are listed, but only abnormal results are displayed) Labs Reviewed - No data to display  EKG None  Radiology No results found.  Procedures Procedures (including critical care  time)  Medications Ordered in ED Medications - No data to display   Initial Impression / Assessment and Plan / ED Course  I have reviewed the triage vital signs and the nursing notes.  Pertinent labs & imaging results that were available during my care of the patient were reviewed by me and considered in my medical decision making (see chart for details).    Patient discussed with and seen by Dr. Manus Gunning. Consult with neuro. Stroke orders initiated with concern for vertebral artery dissection. Patient signed out to Dr. Manus Gunning pending completion of diagnostic testtng.   Final Clinical Impressions(s) / ED Diagnoses   Final diagnoses:  None    ED Discharge Orders    None       Felicie Morn, NP 04/15/18 [id number]    Glynn Octave, MD 04/15/18 [phone]

## 2018-04-15 NOTE — ED Notes (Signed)
Pt returns from MRI family in room

## 2018-04-15 NOTE — ED Notes (Addendum)
Pt ambulated in hallway. Pt very unsteady, weak and needing assistance. EDP notified and orders placed.

## 2018-04-15 NOTE — Evaluation (Signed)
Physical Therapy Evaluation Patient Details Name: Anna Fox MRN: [id number] DOB: [birth year] Today's Date: 04/15/2018   History of Present Illness  22yo female who was pitching during a softball game and flung her head back during a pitching motion, felt a click in her neck with immediate L sided neck pain. Progressed to having significant difficulty with ambulation. CTA clear, CT head clear, C-spine clear, MRI of brain and C-spine both clear. PMH knee surgery, snapping hip syndrome   Clinical Impression   Patient received in bed, pleasant and willing to participate in skilled PT session. Able to complete functional bed mobility with Mod(I). Light touch sensation intact, did identify some L UE weakness (see note below for details) and especially in biceps group, RAM testing mildly impaired L UE but could be related to UE weakness, ROOS test with mild delay in capillary refill L UE. Able to complete functional transfer with S and extended time, ambulated approximately 32f with min guard for safety and steadying with ongoing mild ataxic pattern and significant decreased gait speed. She reports she feels she is slowly improving overall. She was left in bed with all needs met, visitor present. She will continue to benefit from skilled PT services during this hospital stay, and pending further improvement may also benefit from skilled PT services in the OP setting moving forward.     Follow Up Recommendations Outpatient PT(neuro rehab )    Equipment Recommendations  Crutches    Recommendations for Other Services       Precautions / Restrictions Precautions Precautions: Fall Restrictions Weight Bearing Restrictions: No      Mobility  Bed Mobility Overal bed mobility: Modified Independent                Transfers Overall transfer level: Needs assistance   Transfers: Sit to/from Stand Sit to Stand: Supervision         General transfer comment: S and extended time for  safety, no physical assist given   Ambulation/Gait Ambulation/Gait assistance: Min guard Gait Distance (Feet): 60 Feet Assistive device: None Gait Pattern/deviations: Step-through pattern;Decreased step length - right;Decreased step length - left;Decreased stride length;Decreased dorsiflexion - right;Decreased dorsiflexion - left;Ataxic;Narrow base of support Gait velocity: decreased    General Gait Details: very slow gait pattern for age, ongoing mild ataxia noted, increased activation of proximal musculature in B hips likely due to impaired balance. 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   Modified Rankin (Stroke Patients Only)       Balance Overall balance assessment: Needs assistance Sitting-balance support: Bilateral upper extremity supported;Feet supported Sitting balance-Leahy Scale: Good     Standing balance support: During functional activity;No upper extremity supported Standing balance-Leahy Scale: Fair                               Pertinent Vitals/Pain Pain Assessment: 0-10 Pain Score: 5  Pain Location: neck  Pain Descriptors / Indicators: Aching;Sore Pain Intervention(s): Limited activity within patient's tolerance;Monitored during session    HQueensexpects to be discharged to:: Private residence Living Arrangements: Non-relatives/Friends Available Help at Discharge: Friend(s);Available PRN/intermittently Type of Home: Apartment Home Access: Level entry     Home Layout: Two level Home Equipment: None      Prior Function Level of Independence: Independent  Hand Dominance        Extremity/Trunk Assessment   Upper Extremity Assessment Upper Extremity Assessment: RUE deficits/detail;LUE deficits/detail RUE Deficits / Details: WNL, MMT 5/5, RAM WNL light touch sensation WNL, ROOS test WNL, finger opposition and ABD/ADD WNL  RUE Sensation: WNL RUE  Coordination: WNL LUE Deficits / Details: MMT shoulder grossly 4/5, tricep 5/5, bicep 4-/5, wrist flexion/extension 5/5; mild difficulty with RAM possibly limited by biceps weakness; light touch sensation WNL, ROOS test mild delay in capillary refill, finger opposition/ABD and ADD WNL  LUE Sensation: WNL LUE Coordination: decreased gross motor    Lower Extremity Assessment Lower Extremity Assessment: Overall WFL for tasks assessed    Cervical / Trunk Assessment Cervical / Trunk Assessment: Normal  Communication   Communication: No difficulties  Cognition Arousal/Alertness: Awake/alert Behavior During Therapy: WFL for tasks assessed/performed Overall Cognitive Status: Within Functional Limits for tasks assessed                                        General Comments General comments (skin integrity, edema, etc.): no dizziness, no visual disturbances; patient reports she feels she is slowly improving     Exercises     Assessment/Plan    PT Assessment Patient needs continued PT services  PT Problem List Decreased strength;Decreased knowledge of use of DME;Decreased activity tolerance;Decreased balance;Pain;Decreased mobility;Decreased coordination       PT Treatment Interventions DME instruction;Balance training;Gait training;Neuromuscular re-education;Stair training;Functional mobility training;Therapeutic activities;Therapeutic exercise;Manual techniques    PT Goals (Current goals can be found in the Care Plan section)  Acute Rehab PT Goals Patient Stated Goal: to get well  PT Goal Formulation: With patient Time For Goal Achievement: 04/29/18 Potential to Achieve Goals: Good    Frequency Min 4X/week   Barriers to discharge        Co-evaluation               AM-PAC PT "6 Clicks" Daily Activity  Outcome Measure Difficulty turning over in bed (including adjusting bedclothes, sheets and blankets)?: None Difficulty moving from lying on back to  sitting on the side of the bed? : None Difficulty sitting down on and standing up from a chair with arms (e.g., wheelchair, bedside commode, etc,.)?: A Little Help needed moving to and from a bed to chair (including a wheelchair)?: A Little Help needed walking in hospital room?: A Little Help needed climbing 3-5 steps with a railing? : A Lot 6 Click Score: 19    End of Session   Activity Tolerance: Patient tolerated treatment well Patient left: in bed;with family/visitor present;with call bell/phone within reach   PT Visit Diagnosis: Unsteadiness on feet (R26.81);Other abnormalities of gait and mobility (R26.89);Ataxic gait (R26.0);Muscle weakness (generalized) (M62.81)    Time: 4492-0100 PT Time Calculation (min) (ACUTE ONLY): 18 min   Charges:   PT Evaluation $PT Eval Moderate Complexity: 1 Mod          Deniece Ree PT, DPT, CBIS  Supplemental Physical Therapist [place]    Pager [phone] Acute Rehab Office [phone]

## 2018-04-15 NOTE — H&P (Signed)
History and Physical    Anna Fox ZOX:[id number] DOB: 01-09-1996 DOA: 04/14/2018  PCP:   [facility], [place] Consultants:  None Patient coming from: In college at [employer]; NOK: Mom, [phone]  Chief Complaint:  Neck pain, ataxia  HPI: Anna Fox is a 22 y.o. female with no past medical history presenting with neck pain and difficulty walking.  She was practicing pitching, threw her head back, neck hurt, legs wobbly.  She was sitting for 150-20 minutes, when she stood she was very wobbly and had difficulty walking.  Denies h/o neck problems.  Pain is still present and in the left posterior neck.  She heard a click in her neck when this happened.  No symptoms anywhere else other than the neck and with walking.  She was able to walk from the field to the dugout without difficulty.     ED Course: Pitched in college game.  Threw her head back and felt a pop, couldn't walk.  Negative CT head and neck.  Neurology has seen.  MRIs head and C-spine are ok.  Ataxia, can't walk with assistance.  Needs PT/OT.  Declining pain medication.  Review of Systems: As per HPI; otherwise review of systems reviewed and negative.   Ambulatory Status:  Ambulates without assistance  Past Medical History:  Diagnosis Date  . Asthma   . Snapping hip syndrome     Past Surgical History:  Procedure Laterality Date  . KNEE SURGERY      Social History   Socioeconomic History  . Marital status: Single    Spouse name: Not on file  . Number of children: Not on file  . Years of education: Not on file  . Highest education level: Not on file  Occupational History  . Occupation: Holiday representative at [employer]  . Financial resource strain: Not on file  . Food insecurity:    Worry: Not on file    Inability: Not on file  . Transportation needs:    Medical: Not on file    Non-medical: Not on file  Tobacco Use  . Smoking status: Never Smoker  . Smokeless tobacco: Never Used  Substance and Sexual  Activity  . Alcohol use: No  . Drug use: No  . Sexual activity: Not on file  Lifestyle  . Physical activity:    Days per week: Not on file    Minutes per session: Not on file  . Stress: Not on file  Relationships  . Social connections:    Talks on phone: Not on file    Gets together: Not on file    Attends religious service: Not on file    Active member of club or organization: Not on file    Attends meetings of clubs or organizations: Not on file    Relationship status: Not on file  . Intimate partner violence:    Fear of current or ex partner: Not on file    Emotionally abused: Not on file    Physically abused: Not on file    Forced sexual activity: Not on file  Other Topics Concern  . Not on file  Social History Narrative  . Not on file    No Known Allergies  History reviewed. No pertinent family history.  Prior to Admission medications   Medication Sig Start Date End Date Taking? Authorizing Provider  albuterol (PROAIR HFA) 108 (90 Base) MCG/ACT inhaler Inhale 2 puffs into the lungs every 6 (six) hours as needed for wheezing.  01/09/17  Yes [provider]  Fluticasone-Salmeterol (ADVAIR) 250-50 MCG/DOSE AEPB Inhale into the lungs 2 (two) times daily. 06/25/17 06/25/18 Yes [provider]  Norethindrone Acetate-Ethinyl Estradiol (JUNEL 1.5/30) 1.5-30 MG-MCG tablet Take 1 tablet by mouth daily. 01/09/17  Yes [provider]  predniSONE (DELTASONE) 20 MG tablet 3 tabs po day one, then 2 po daily x 4 days Patient not taking: Reported on 04/15/2018 06/17/17   Loren Racer, MD  sulfamethoxazole-trimethoprim (BACTRIM DS,SEPTRA DS) 800-160 MG tablet Take 1 tablet by mouth 2 (two) times daily. Patient not taking: Reported on 06/16/2017 04/02/17   Payton Mccallum, MD    Physical Exam: Vitals:   04/14/18 2050 04/14/18 2051 04/15/18 0154 04/15/18 0621  BP: 129/83  137/83 132/77  Pulse: 84  68 60  Resp: 16  18 18   Temp: 98.2 F (36.8 C)     TempSrc:  Oral     SpO2: 100%  99% 100%  Weight:  70.3 kg    Height:  5' 5.5" (1.664 m)       General:  Appears calm and comfortable and is NAD Eyes:  PERRL, EOMI, normal lids, iris ENT:  grossly normal hearing, lips & tongue, mmm; appropriate dentition Neck:  no LAD, masses or thyromegaly; no carotid bruits.  She has TTP along the spinous processes and just to the left in the C4-7 region Cardiovascular:  RRR, no m/r/g. No LE edema.  Respiratory:   CTA bilaterally with no wheezes/rales/rhonchi.  Normal respiratory effort. Abdomen:  soft, NT, ND, NABS Back:   normal alignment, no CVAT Skin:  no rash or induration seen on limited exam Musculoskeletal:  grossly normal tone BUE/BLE, good ROM, no bony abnormality Psychiatric:  grossly normal mood and affect, speech fluent and appropriate, AOx3 Neurologic:  CN 2-12 grossly intact, moves all extremities in coordinated fashion, sensation intact.  She remains ataxic and appears uncomfortable when standing/attempting to walk    Radiological Exams on Admission: Ct Angio Head W Or Wo Contrast  Result Date: 04/15/2018 CLINICAL DATA:  Initial evaluation for acute left-sided neck pain status post sports injury. EXAM: CT ANGIOGRAPHY HEAD AND NECK CT CERVICAL SPINE WITHOUT CONTRAST TECHNIQUE: Multidetector CT imaging of the head and neck was performed using the standard protocol during bolus administration of intravenous contrast. Multiplanar CT image reconstructions and MIPs were obtained to evaluate the vascular anatomy. Carotid stenosis measurements (when applicable) are obtained utilizing NASCET criteria, using the distal internal carotid diameter as the denominator. CONTRAST:  50mL ISOVUE-370 IOPAMIDOL (ISOVUE-370) INJECTION 76% COMPARISON:  None. FINDINGS: CT HEAD FINDINGS Brain: Cerebral volume within normal limits for patient age. No evidence for acute intracranial hemorrhage. No findings to suggest acute large vessel territory infarct. No mass lesion, midline  shift, or mass effect. Ventricles are normal in size without evidence for hydrocephalus. No extra-axial fluid collection identified. Vascular: No hyperdense vessel identified. Skull: Scalp soft tissues demonstrate no acute abnormality. Calvarium intact. Sinuses/Orbits: Globes and orbital soft tissues within normal limits. Visualized paranasal sinuses are clear. No mastoid effusion. CTA NECK FINDINGS Aortic arch: Visualized aortic arch of normal caliber with normal branch pattern. No flow-limiting stenosis about the origin of the great vessels. Visualized subclavian arteries widely patent. Right carotid system: Right common and internal carotid arteries are widely patent without stenosis, dissection, or occlusion. Left carotid system: Left common and internal carotid arteries widely patent without stenosis, dissection, or occlusion. Vertebral arteries: Both vertebral arteries arise from the subclavian arteries. Left vertebral artery slightly dominant. Evaluation of a short-segment  area of the proximal left V1/V2 region somewhat limited by adjacent venous contamination. Vertebral arteries otherwise widely patent without stenosis, dissection, or occlusion. Skeleton: Smooth reversal of the normal cervical lordosis, which may be related to positioning and/or muscular spasm. No listhesis or malalignment. Skull base intact. Normal C1-2 articulations are preserved in the dens is intact. Vertebral body heights maintained. No acute fracture or other osseous abnormality. No discrete osseous lesions. No significant disc pathology seen within the cervical spine. No stenosis. Other neck: No other acute soft tissue abnormality within the neck. No adenopathy. Thyroid normal. Salivary glands normal. No abnormal prevertebral edema. Upper chest: Visualized upper chest within normal limits. Visualized lungs are clear. Review of the MIP images confirms the above findings CTA HEAD FINDINGS Anterior circulation: Internal carotid arteries  widely patent to the termini without stenosis. A1 segments, anterior communicating artery common anterior cerebral arteries widely patent bilaterally. M1 segments widely patent. Normal MCA bifurcations. Distal MCA branches well perfused and symmetric. Posterior circulation: Vertebral arteries widely patent to the vertebrobasilar junction without stenosis. Posterior inferior cerebral arteries patent bilaterally. Basilar artery widely patent to its distal aspect. Superior cerebellar and posterior cerebral arteries widely patent bilaterally. Small right posterior communicating artery noted. Venous sinuses: Grossly patent, although not well assessed due to timing of the contrast bolus. Anatomic variants: None significant. Delayed phase: Not performed. Review of the MIP images confirms the above findings IMPRESSION: CT HEAD IMPRESSION Normal head CT.  No acute intracranial abnormality identified. CTA HEAD AND NECK IMPRESSION Normal CTA of the head and neck. No evidence for arterial dissection or other acute vascular abnormality. No large vessel occlusion. CT CERVICAL SPINE IMPRESSION No CT evidence for acute fracture or other osseous abnormality within the cervical spine. Electronically Signed   By: Rise Mu M.D.   On: 04/15/2018 02:39   Ct Angio Neck W And/or Wo Contrast  Result Date: 04/15/2018 CLINICAL DATA:  Initial evaluation for acute left-sided neck pain status post sports injury. EXAM: CT ANGIOGRAPHY HEAD AND NECK CT CERVICAL SPINE WITHOUT CONTRAST TECHNIQUE: Multidetector CT imaging of the head and neck was performed using the standard protocol during bolus administration of intravenous contrast. Multiplanar CT image reconstructions and MIPs were obtained to evaluate the vascular anatomy. Carotid stenosis measurements (when applicable) are obtained utilizing NASCET criteria, using the distal internal carotid diameter as the denominator. CONTRAST:  50mL ISOVUE-370 IOPAMIDOL (ISOVUE-370) INJECTION  76% COMPARISON:  None. FINDINGS: CT HEAD FINDINGS Brain: Cerebral volume within normal limits for patient age. No evidence for acute intracranial hemorrhage. No findings to suggest acute large vessel territory infarct. No mass lesion, midline shift, or mass effect. Ventricles are normal in size without evidence for hydrocephalus. No extra-axial fluid collection identified. Vascular: No hyperdense vessel identified. Skull: Scalp soft tissues demonstrate no acute abnormality. Calvarium intact. Sinuses/Orbits: Globes and orbital soft tissues within normal limits. Visualized paranasal sinuses are clear. No mastoid effusion. CTA NECK FINDINGS Aortic arch: Visualized aortic arch of normal caliber with normal branch pattern. No flow-limiting stenosis about the origin of the great vessels. Visualized subclavian arteries widely patent. Right carotid system: Right common and internal carotid arteries are widely patent without stenosis, dissection, or occlusion. Left carotid system: Left common and internal carotid arteries widely patent without stenosis, dissection, or occlusion. Vertebral arteries: Both vertebral arteries arise from the subclavian arteries. Left vertebral artery slightly dominant. Evaluation of a short-segment area of the proximal left V1/V2 region somewhat limited by adjacent venous contamination. Vertebral arteries otherwise widely patent without stenosis, dissection,  or occlusion. Skeleton: Smooth reversal of the normal cervical lordosis, which may be related to positioning and/or muscular spasm. No listhesis or malalignment. Skull base intact. Normal C1-2 articulations are preserved in the dens is intact. Vertebral body heights maintained. No acute fracture or other osseous abnormality. No discrete osseous lesions. No significant disc pathology seen within the cervical spine. No stenosis. Other neck: No other acute soft tissue abnormality within the neck. No adenopathy. Thyroid normal. Salivary glands  normal. No abnormal prevertebral edema. Upper chest: Visualized upper chest within normal limits. Visualized lungs are clear. Review of the MIP images confirms the above findings CTA HEAD FINDINGS Anterior circulation: Internal carotid arteries widely patent to the termini without stenosis. A1 segments, anterior communicating artery common anterior cerebral arteries widely patent bilaterally. M1 segments widely patent. Normal MCA bifurcations. Distal MCA branches well perfused and symmetric. Posterior circulation: Vertebral arteries widely patent to the vertebrobasilar junction without stenosis. Posterior inferior cerebral arteries patent bilaterally. Basilar artery widely patent to its distal aspect. Superior cerebellar and posterior cerebral arteries widely patent bilaterally. Small right posterior communicating artery noted. Venous sinuses: Grossly patent, although not well assessed due to timing of the contrast bolus. Anatomic variants: None significant. Delayed phase: Not performed. Review of the MIP images confirms the above findings IMPRESSION: CT HEAD IMPRESSION Normal head CT.  No acute intracranial abnormality identified. CTA HEAD AND NECK IMPRESSION Normal CTA of the head and neck. No evidence for arterial dissection or other acute vascular abnormality. No large vessel occlusion. CT CERVICAL SPINE IMPRESSION No CT evidence for acute fracture or other osseous abnormality within the cervical spine. Electronically Signed   By: Rise Mu M.D.   On: 04/15/2018 02:39   Mr Brain Wo Contrast  Result Date: 04/15/2018 CLINICAL DATA:  Initial evaluation for acute neck pain. EXAM: MRI HEAD WITHOUT CONTRAST TECHNIQUE: Multiplanar, multiecho pulse sequences of the brain and surrounding structures were obtained without intravenous contrast. COMPARISON:  Prior CTA from earlier the same day. FINDINGS: Brain: Cerebral volume within normal limits for patient age. No focal parenchymal signal abnormality  identified. No abnormal foci of restricted diffusion to suggest acute or subacute ischemia. Gray-white matter differentiation well maintained. No encephalomalacia to suggest chronic infarction. No foci of susceptibility artifact to suggest acute or chronic intracranial hemorrhage. Mass lesion, midline shift or mass effect. No hydrocephalus. No extra-axial fluid collection. Major dural sinuses are grossly patent. Pituitary gland and suprasellar region are normal. Midline structures intact and normal. Vascular: Major intracranial vascular flow voids well maintained and normal in appearance. Skull and upper cervical spine: Craniocervical junction normal. Visualized upper cervical spine within normal limits. Bone marrow signal intensity normal. No scalp soft tissue abnormality. Sinuses/Orbits: Globes and orbital soft tissues within normal limits. Mild scattered mucosal thickening throughout the paranasal sinuses. No air-fluid level to suggest acute sinusitis. Trace opacity bilateral mastoid air cells, of doubtful significance. Inner ear structures normal. Other: None. IMPRESSION: Normal brain MRI.  No acute intracranial abnormality identified. Electronically Signed   By: Rise Mu M.D.   On: 04/15/2018 06:10   Mr Cervical Spine W Or Wo Contrast  Result Date: 04/15/2018 CLINICAL DATA:  Initial evaluation for acute cervical spine trauma. Evaluate for ligamentous injury. EXAM: MRI CERVICAL SPINE WITHOUT AND WITH CONTRAST TECHNIQUE: Multiplanar and multiecho pulse sequences of the cervical spine, to include the craniocervical junction and cervicothoracic junction, were obtained without and with intravenous contrast. CONTRAST:  7 cc of Gadavist. COMPARISON:  Prior CT from earlier same day. FINDINGS: Alignment: Straightening  of the normal cervical lordosis. No listhesis or malalignment. Vertebrae: Vertebral body heights maintained without evidence for acute or chronic fracture. Bone marrow signal intensity  within normal limits. No discrete or worrisome osseous lesions. No abnormal marrow edema or enhancement. Cord: Signal intensity within the cervical spinal cord is normal. No cord signal abnormality. No abnormal enhancement. No findings to suggest ligamentous injury. Posterior Fossa, vertebral arteries, paraspinal tissues: Visualized brain and posterior fossa within normal limits. Craniocervical junction normal. Paraspinous and prevertebral soft tissues normal. Normal intravascular flow voids present within the vertebral arteries bilaterally. Disc levels: No significant disc pathology seen within the cervical spine. No disc bulge or disc protrusion. No significant facet disease. No canal or foraminal stenosis. IMPRESSION: Normal MRI of the cervical spine. No acute traumatic injury identified. No significant disc pathology, stenosis, or evidence for neural impingement. Electronically Signed   By: Rise Mu M.D.   On: 04/15/2018 06:19   Ct C-spine No Charge  Result Date: 04/15/2018 CLINICAL DATA:  Initial evaluation for acute left-sided neck pain status post sports injury. EXAM: CT ANGIOGRAPHY HEAD AND NECK CT CERVICAL SPINE WITHOUT CONTRAST TECHNIQUE: Multidetector CT imaging of the head and neck was performed using the standard protocol during bolus administration of intravenous contrast. Multiplanar CT image reconstructions and MIPs were obtained to evaluate the vascular anatomy. Carotid stenosis measurements (when applicable) are obtained utilizing NASCET criteria, using the distal internal carotid diameter as the denominator. CONTRAST:  50mL ISOVUE-370 IOPAMIDOL (ISOVUE-370) INJECTION 76% COMPARISON:  None. FINDINGS: CT HEAD FINDINGS Brain: Cerebral volume within normal limits for patient age. No evidence for acute intracranial hemorrhage. No findings to suggest acute large vessel territory infarct. No mass lesion, midline shift, or mass effect. Ventricles are normal in size without evidence for  hydrocephalus. No extra-axial fluid collection identified. Vascular: No hyperdense vessel identified. Skull: Scalp soft tissues demonstrate no acute abnormality. Calvarium intact. Sinuses/Orbits: Globes and orbital soft tissues within normal limits. Visualized paranasal sinuses are clear. No mastoid effusion. CTA NECK FINDINGS Aortic arch: Visualized aortic arch of normal caliber with normal branch pattern. No flow-limiting stenosis about the origin of the great vessels. Visualized subclavian arteries widely patent. Right carotid system: Right common and internal carotid arteries are widely patent without stenosis, dissection, or occlusion. Left carotid system: Left common and internal carotid arteries widely patent without stenosis, dissection, or occlusion. Vertebral arteries: Both vertebral arteries arise from the subclavian arteries. Left vertebral artery slightly dominant. Evaluation of a short-segment area of the proximal left V1/V2 region somewhat limited by adjacent venous contamination. Vertebral arteries otherwise widely patent without stenosis, dissection, or occlusion. Skeleton: Smooth reversal of the normal cervical lordosis, which may be related to positioning and/or muscular spasm. No listhesis or malalignment. Skull base intact. Normal C1-2 articulations are preserved in the dens is intact. Vertebral body heights maintained. No acute fracture or other osseous abnormality. No discrete osseous lesions. No significant disc pathology seen within the cervical spine. No stenosis. Other neck: No other acute soft tissue abnormality within the neck. No adenopathy. Thyroid normal. Salivary glands normal. No abnormal prevertebral edema. Upper chest: Visualized upper chest within normal limits. Visualized lungs are clear. Review of the MIP images confirms the above findings CTA HEAD FINDINGS Anterior circulation: Internal carotid arteries widely patent to the termini without stenosis. A1 segments, anterior  communicating artery common anterior cerebral arteries widely patent bilaterally. M1 segments widely patent. Normal MCA bifurcations. Distal MCA branches well perfused and symmetric. Posterior circulation: Vertebral arteries widely patent to the vertebrobasilar  junction without stenosis. Posterior inferior cerebral arteries patent bilaterally. Basilar artery widely patent to its distal aspect. Superior cerebellar and posterior cerebral arteries widely patent bilaterally. Small right posterior communicating artery noted. Venous sinuses: Grossly patent, although not well assessed due to timing of the contrast bolus. Anatomic variants: None significant. Delayed phase: Not performed. Review of the MIP images confirms the above findings IMPRESSION: CT HEAD IMPRESSION Normal head CT.  No acute intracranial abnormality identified. CTA HEAD AND NECK IMPRESSION Normal CTA of the head and neck. No evidence for arterial dissection or other acute vascular abnormality. No large vessel occlusion. CT CERVICAL SPINE IMPRESSION No CT evidence for acute fracture or other osseous abnormality within the cervical spine. Electronically Signed   By: Rise Mu M.D.   On: 04/15/2018 02:39    EKG: not done   Labs on Admission: I have personally reviewed the available labs and imaging studies at the time of the admission.  Pertinent labs:   K+ 3.2 CMP otherwise WNL Normal CBC UA: trace Hgb, rare bacteria UDS negative Upreg negative  Assessment/Plan Principal Problem:   Ataxia Active Problems:   Asthma in adult, moderate persistent, uncomplicated   Neck pain, musculoskeletal   Neck pain, ataxia -Patient with an apparent stun injury of her posterior neck -CT and MRI have been negative -She remains somewhat ataxic -Anticipate spontaneous resolution -Will observe overnight -PT/OT consultations -Robaxin prn pain/spasm  Asthma -Continue Advair (formularly substitution with Dulera) -Continue prn  Albuterol    DVT prophylaxis: Early ambultion Code Status:  Full  Family Communication: Sports medicine attendant from school present throughout evaluation  Disposition Plan:  Home once clinically improved Consults called:  Neurology; PT/OT  Admission status: It is my clinical opinion that referral for OBSERVATION is reasonable and necessary in this patient based on the above information provided. The aforementioned taken together are felt to place the patient at high risk for further clinical deterioration. However it is anticipated that the patient may be medically stable for discharge from the hospital within 24 to 48 hours.    Jonah Blue MD [facility]  If note is complete, please contact covering daytime or nighttime physician. [email or web address] Password [email or web address]  04/15/2018, 10:08 AM

## 2018-04-16 DIAGNOSIS — M542 Cervicalgia: Secondary | ICD-10-CM

## 2018-04-16 DIAGNOSIS — R269 Unspecified abnormalities of gait and mobility: Secondary | ICD-10-CM

## 2018-04-16 DIAGNOSIS — F444 Conversion disorder with motor symptom or deficit: Secondary | ICD-10-CM | POA: Diagnosis not present

## 2018-04-16 DIAGNOSIS — R482 Apraxia: Secondary | ICD-10-CM

## 2018-04-16 LAB — CBC
HEMATOCRIT: 39.9 % (ref 36.0–46.0)
HEMOGLOBIN: 13.1 g/dL (ref 12.0–15.0)
MCH: 28.9 pg (ref 26.0–34.0)
MCHC: 32.8 g/dL (ref 30.0–36.0)
MCV: 88.1 fL (ref 78.0–100.0)
Platelets: 234 10*3/uL (ref 150–400)
RBC: 4.53 MIL/uL (ref 3.87–5.11)
RDW: 12.5 % (ref 11.5–15.5)
WBC: 7.3 10*3/uL (ref 4.0–10.5)

## 2018-04-16 LAB — BASIC METABOLIC PANEL
Anion gap: 6 (ref 5–15)
BUN: 18 mg/dL (ref 6–20)
CHLORIDE: 108 mmol/L (ref 98–111)
CO2: 25 mmol/L (ref 22–32)
Calcium: 9 mg/dL (ref 8.9–10.3)
Creatinine, Ser: 1.23 mg/dL — ABNORMAL HIGH (ref 0.44–1.00)
GFR calc Af Amer: >60 mL/min (ref >60)
GLUCOSE: 100 mg/dL — AB (ref 70–99)
POTASSIUM: 4.4 mmol/L (ref 3.5–5.1)
SODIUM: 139 mmol/L (ref 135–145)

## 2018-04-16 LAB — HIV ANTIBODY (ROUTINE TESTING W REFLEX): HIV SCREEN 4TH GENERATION: NONREACTIVE

## 2018-04-16 MED ORDER — UNABLE TO FIND: 0 refills | Status: AC

## 2018-04-16 NOTE — Discharge Summary (Signed)
Physician Discharge Summary  Anna Fox ZOX:[id number] DOB: 11-06-1995 DOA: 04/14/2018  PCP: Tim Lair, PA-C  Admit date: 04/14/2018 Discharge date: 04/16/2018  Admitted From: Home   Disposition: home Recommendations for Outpatient Follow-up:  1. Follow up with PCP this coming week 2. Outpatient PT-- script given 3. BMP 1 week  Home Health: no Equipment/Devices: none  Discharge Condition: Stable  CODE STATUS: FULL   Diet recommendation: regular  Brief/Interim Summary:  22 yo with a history of asthma  otherwise healthy brought on 04/14/2018 to the ER, for evaluation of posterior neck pain and difficulty walking after experiencing difficulty with ambulation "feeling wobbly "after baseball practice.  This was preceded by a "click in the neck". CT of the head and neck were negative. MRI of the head and C-spine were ordered, which were negative as well.  CT angio head negative for dissection. She was given Robaxin as needed for pain and for spasms.  On presentation, she was found to be ataxic.  Neurology consultation was obtained.  Over the following days, the patient began to feel better, with gait becoming more stable.  She admits she has full strength.  She is able to stand on her own, minimal starting gait.  The acute onset of gait unsteadiness after whiplash-like mechanism on the neck was more consistent with a astasia-apraxia rather than ataxia.  PT is recommended once she is being discharged home, and she needs to take a week off of her support in order to give her time to recover.   Discharge Diagnoses:  Principal Problem:   Astasia Active Problems:   Asthma in adult, moderate persistent, uncomplicated   Neck pain, musculoskeletal   Gait apraxia   Astasia/Apraxia: Likely due to whiplash during sports,  Negative MRI of the head and CT spine, CT angio head and neck neg for dissection. essentially negative workup, with improved symptoms since admission Discharge with OP PT   Follow with PCP within next week prior to initiating any physical activity  Tylenol for pain   Asthma, no acute issues  Continue inhalers    Discharge Instructions  Discharge Instructions    Diet general   Complete by:  As directed    Discharge instructions   Complete by:  As directed    Walk with assistance until your symptoms resolve-- outpatient PT-- do not resume sports until cleared by PT Follow up with your primary doctor early next week for post hospital evaluation   Discharge instructions   Complete by:  As directed    Can use heat for muscle soreness   Increase activity slowly   Complete by:  As directed      Allergies as of 04/16/2018   No Known Allergies     Medication List    TAKE these medications   Fluticasone-Salmeterol 250-50 MCG/DOSE Aepb Commonly known as:  ADVAIR Inhale into the lungs 2 (two) times daily.   JUNEL 1.5/30 1.5-30 MG-MCG tablet Generic drug:  Norethindrone Acetate-Ethinyl Estradiol Take 1 tablet by mouth daily.   PROAIR HFA 108 (90 Base) MCG/ACT inhaler Generic drug:  albuterol Inhale 2 puffs into the lungs every 6 (six) hours as needed for wheezing.   UNABLE TO FIND Outpatient physical therapy Dx: ataxia Evaluate and treat      Follow-up Information    Tim Lair, PA-C Follow up in 1 week(s).   Specialty:  Physician Assistant Contact information: [address] [place] [address] [phone]  No Known Allergies  Consultations: Neurology, Dr. Otelia Limes  Procedures/Studies: Ct Angio Head W Or Wo Contrast  Result Date: 04/15/2018 CLINICAL DATA:  Initial evaluation for acute left-sided neck pain status post sports injury. EXAM: CT ANGIOGRAPHY HEAD AND NECK CT CERVICAL SPINE WITHOUT CONTRAST TECHNIQUE: Multidetector CT imaging of the head and neck was performed using the standard protocol during bolus administration of intravenous contrast. Multiplanar CT image reconstructions and MIPs were obtained to  evaluate the vascular anatomy. Carotid stenosis measurements (when applicable) are obtained utilizing NASCET criteria, using the distal internal carotid diameter as the denominator. CONTRAST:  50mL ISOVUE-370 IOPAMIDOL (ISOVUE-370) INJECTION 76% COMPARISON:  None. FINDINGS: CT HEAD FINDINGS Brain: Cerebral volume within normal limits for patient age. No evidence for acute intracranial hemorrhage. No findings to suggest acute large vessel territory infarct. No mass lesion, midline shift, or mass effect. Ventricles are normal in size without evidence for hydrocephalus. No extra-axial fluid collection identified. Vascular: No hyperdense vessel identified. Skull: Scalp soft tissues demonstrate no acute abnormality. Calvarium intact. Sinuses/Orbits: Globes and orbital soft tissues within normal limits. Visualized paranasal sinuses are clear. No mastoid effusion. CTA NECK FINDINGS Aortic arch: Visualized aortic arch of normal caliber with normal branch pattern. No flow-limiting stenosis about the origin of the great vessels. Visualized subclavian arteries widely patent. Right carotid system: Right common and internal carotid arteries are widely patent without stenosis, dissection, or occlusion. Left carotid system: Left common and internal carotid arteries widely patent without stenosis, dissection, or occlusion. Vertebral arteries: Both vertebral arteries arise from the subclavian arteries. Left vertebral artery slightly dominant. Evaluation of a short-segment area of the proximal left V1/V2 region somewhat limited by adjacent venous contamination. Vertebral arteries otherwise widely patent without stenosis, dissection, or occlusion. Skeleton: Smooth reversal of the normal cervical lordosis, which may be related to positioning and/or muscular spasm. No listhesis or malalignment. Skull base intact. Normal C1-2 articulations are preserved in the dens is intact. Vertebral body heights maintained. No acute fracture or other  osseous abnormality. No discrete osseous lesions. No significant disc pathology seen within the cervical spine. No stenosis. Other neck: No other acute soft tissue abnormality within the neck. No adenopathy. Thyroid normal. Salivary glands normal. No abnormal prevertebral edema. Upper chest: Visualized upper chest within normal limits. Visualized lungs are clear. Review of the MIP images confirms the above findings CTA HEAD FINDINGS Anterior circulation: Internal carotid arteries widely patent to the termini without stenosis. A1 segments, anterior communicating artery common anterior cerebral arteries widely patent bilaterally. M1 segments widely patent. Normal MCA bifurcations. Distal MCA branches well perfused and symmetric. Posterior circulation: Vertebral arteries widely patent to the vertebrobasilar junction without stenosis. Posterior inferior cerebral arteries patent bilaterally. Basilar artery widely patent to its distal aspect. Superior cerebellar and posterior cerebral arteries widely patent bilaterally. Small right posterior communicating artery noted. Venous sinuses: Grossly patent, although not well assessed due to timing of the contrast bolus. Anatomic variants: None significant. Delayed phase: Not performed. Review of the MIP images confirms the above findings IMPRESSION: CT HEAD IMPRESSION Normal head CT.  No acute intracranial abnormality identified. CTA HEAD AND NECK IMPRESSION Normal CTA of the head and neck. No evidence for arterial dissection or other acute vascular abnormality. No large vessel occlusion. CT CERVICAL SPINE IMPRESSION No CT evidence for acute fracture or other osseous abnormality within the cervical spine. Electronically Signed   By: Rise Mu M.D.   On: 04/15/2018 02:39   Ct Angio Neck W And/or Wo Contrast  Result Date: 04/15/2018  CLINICAL DATA:  Initial evaluation for acute left-sided neck pain status post sports injury. EXAM: CT ANGIOGRAPHY HEAD AND NECK CT  CERVICAL SPINE WITHOUT CONTRAST TECHNIQUE: Multidetector CT imaging of the head and neck was performed using the standard protocol during bolus administration of intravenous contrast. Multiplanar CT image reconstructions and MIPs were obtained to evaluate the vascular anatomy. Carotid stenosis measurements (when applicable) are obtained utilizing NASCET criteria, using the distal internal carotid diameter as the denominator. CONTRAST:  50mL ISOVUE-370 IOPAMIDOL (ISOVUE-370) INJECTION 76% COMPARISON:  None. FINDINGS: CT HEAD FINDINGS Brain: Cerebral volume within normal limits for patient age. No evidence for acute intracranial hemorrhage. No findings to suggest acute large vessel territory infarct. No mass lesion, midline shift, or mass effect. Ventricles are normal in size without evidence for hydrocephalus. No extra-axial fluid collection identified. Vascular: No hyperdense vessel identified. Skull: Scalp soft tissues demonstrate no acute abnormality. Calvarium intact. Sinuses/Orbits: Globes and orbital soft tissues within normal limits. Visualized paranasal sinuses are clear. No mastoid effusion. CTA NECK FINDINGS Aortic arch: Visualized aortic arch of normal caliber with normal branch pattern. No flow-limiting stenosis about the origin of the great vessels. Visualized subclavian arteries widely patent. Right carotid system: Right common and internal carotid arteries are widely patent without stenosis, dissection, or occlusion. Left carotid system: Left common and internal carotid arteries widely patent without stenosis, dissection, or occlusion. Vertebral arteries: Both vertebral arteries arise from the subclavian arteries. Left vertebral artery slightly dominant. Evaluation of a short-segment area of the proximal left V1/V2 region somewhat limited by adjacent venous contamination. Vertebral arteries otherwise widely patent without stenosis, dissection, or occlusion. Skeleton: Smooth reversal of the normal  cervical lordosis, which may be related to positioning and/or muscular spasm. No listhesis or malalignment. Skull base intact. Normal C1-2 articulations are preserved in the dens is intact. Vertebral body heights maintained. No acute fracture or other osseous abnormality. No discrete osseous lesions. No significant disc pathology seen within the cervical spine. No stenosis. Other neck: No other acute soft tissue abnormality within the neck. No adenopathy. Thyroid normal. Salivary glands normal. No abnormal prevertebral edema. Upper chest: Visualized upper chest within normal limits. Visualized lungs are clear. Review of the MIP images confirms the above findings CTA HEAD FINDINGS Anterior circulation: Internal carotid arteries widely patent to the termini without stenosis. A1 segments, anterior communicating artery common anterior cerebral arteries widely patent bilaterally. M1 segments widely patent. Normal MCA bifurcations. Distal MCA branches well perfused and symmetric. Posterior circulation: Vertebral arteries widely patent to the vertebrobasilar junction without stenosis. Posterior inferior cerebral arteries patent bilaterally. Basilar artery widely patent to its distal aspect. Superior cerebellar and posterior cerebral arteries widely patent bilaterally. Small right posterior communicating artery noted. Venous sinuses: Grossly patent, although not well assessed due to timing of the contrast bolus. Anatomic variants: None significant. Delayed phase: Not performed. Review of the MIP images confirms the above findings IMPRESSION: CT HEAD IMPRESSION Normal head CT.  No acute intracranial abnormality identified. CTA HEAD AND NECK IMPRESSION Normal CTA of the head and neck. No evidence for arterial dissection or other acute vascular abnormality. No large vessel occlusion. CT CERVICAL SPINE IMPRESSION No CT evidence for acute fracture or other osseous abnormality within the cervical spine. Electronically Signed    By: Rise Mu M.D.   On: 04/15/2018 02:39   Mr Brain Wo Contrast  Result Date: 04/15/2018 CLINICAL DATA:  Initial evaluation for acute neck pain. EXAM: MRI HEAD WITHOUT CONTRAST TECHNIQUE: Multiplanar, multiecho pulse sequences of the brain and  surrounding structures were obtained without intravenous contrast. COMPARISON:  Prior CTA from earlier the same day. FINDINGS: Brain: Cerebral volume within normal limits for patient age. No focal parenchymal signal abnormality identified. No abnormal foci of restricted diffusion to suggest acute or subacute ischemia. Gray-white matter differentiation well maintained. No encephalomalacia to suggest chronic infarction. No foci of susceptibility artifact to suggest acute or chronic intracranial hemorrhage. Mass lesion, midline shift or mass effect. No hydrocephalus. No extra-axial fluid collection. Major dural sinuses are grossly patent. Pituitary gland and suprasellar region are normal. Midline structures intact and normal. Vascular: Major intracranial vascular flow voids well maintained and normal in appearance. Skull and upper cervical spine: Craniocervical junction normal. Visualized upper cervical spine within normal limits. Bone marrow signal intensity normal. No scalp soft tissue abnormality. Sinuses/Orbits: Globes and orbital soft tissues within normal limits. Mild scattered mucosal thickening throughout the paranasal sinuses. No air-fluid level to suggest acute sinusitis. Trace opacity bilateral mastoid air cells, of doubtful significance. Inner ear structures normal. Other: None. IMPRESSION: Normal brain MRI.  No acute intracranial abnormality identified. Electronically Signed   By: Rise Mu M.D.   On: 04/15/2018 06:10   Mr Thoracic Spine Wo Contrast  Result Date: 04/15/2018 CLINICAL DATA:  Patient was pitching a softball game when she flung her head back in a pitching motion. She felt a click in her neck with immediate left-sided  lateral pain. She is able to walk to the dog out but shortly thereafter she had difficulty with ambulation with weak and wobbly knees unable to walk without assistance. Neck pain. Mid back pain. EXAM: MRI THORACIC SPINE WITHOUT CONTRAST TECHNIQUE: Multiplanar, multisequence MR imaging of the thoracic spine was performed. No intravenous contrast was administered. COMPARISON:  MRI of the brain and cervical spine are normal. CT angiography of the head and neck are normal. FINDINGS: The study was performed approximately 5 hours after the pre and postcontrast cervical and brain MRI. There is no significant residual T1 shortening effect to confound the current MRI T-spine interpretation. Alignment: Physiologic. Vertebrae: No fracture, evidence of discitis, or bone lesion. Cord:  Normal signal and morphology. Paraspinal and other soft tissues: Negative. Disc levels: No disc protrusion, spinal stenosis, or intraspinal mass lesion. IMPRESSION: Negative exam.  No cause is seen for the reported symptoms. Electronically Signed   By: Elsie Stain M.D.   On: 04/15/2018 11:29   Mr Cervical Spine W Or Wo Contrast  Result Date: 04/15/2018 CLINICAL DATA:  Initial evaluation for acute cervical spine trauma. Evaluate for ligamentous injury. EXAM: MRI CERVICAL SPINE WITHOUT AND WITH CONTRAST TECHNIQUE: Multiplanar and multiecho pulse sequences of the cervical spine, to include the craniocervical junction and cervicothoracic junction, were obtained without and with intravenous contrast. CONTRAST:  7 cc of Gadavist. COMPARISON:  Prior CT from earlier same day. FINDINGS: Alignment: Straightening of the normal cervical lordosis. No listhesis or malalignment. Vertebrae: Vertebral body heights maintained without evidence for acute or chronic fracture. Bone marrow signal intensity within normal limits. No discrete or worrisome osseous lesions. No abnormal marrow edema or enhancement. Cord: Signal intensity within the cervical spinal cord  is normal. No cord signal abnormality. No abnormal enhancement. No findings to suggest ligamentous injury. Posterior Fossa, vertebral arteries, paraspinal tissues: Visualized brain and posterior fossa within normal limits. Craniocervical junction normal. Paraspinous and prevertebral soft tissues normal. Normal intravascular flow voids present within the vertebral arteries bilaterally. Disc levels: No significant disc pathology seen within the cervical spine. No disc bulge or disc protrusion. No significant  facet disease. No canal or foraminal stenosis. IMPRESSION: Normal MRI of the cervical spine. No acute traumatic injury identified. No significant disc pathology, stenosis, or evidence for neural impingement. Electronically Signed   By: Rise Mu M.D.   On: 04/15/2018 06:19   Ct C-spine No Charge  Result Date: 04/15/2018 CLINICAL DATA:  Initial evaluation for acute left-sided neck pain status post sports injury. EXAM: CT ANGIOGRAPHY HEAD AND NECK CT CERVICAL SPINE WITHOUT CONTRAST TECHNIQUE: Multidetector CT imaging of the head and neck was performed using the standard protocol during bolus administration of intravenous contrast. Multiplanar CT image reconstructions and MIPs were obtained to evaluate the vascular anatomy. Carotid stenosis measurements (when applicable) are obtained utilizing NASCET criteria, using the distal internal carotid diameter as the denominator. CONTRAST:  50mL ISOVUE-370 IOPAMIDOL (ISOVUE-370) INJECTION 76% COMPARISON:  None. FINDINGS: CT HEAD FINDINGS Brain: Cerebral volume within normal limits for patient age. No evidence for acute intracranial hemorrhage. No findings to suggest acute large vessel territory infarct. No mass lesion, midline shift, or mass effect. Ventricles are normal in size without evidence for hydrocephalus. No extra-axial fluid collection identified. Vascular: No hyperdense vessel identified. Skull: Scalp soft tissues demonstrate no acute abnormality.  Calvarium intact. Sinuses/Orbits: Globes and orbital soft tissues within normal limits. Visualized paranasal sinuses are clear. No mastoid effusion. CTA NECK FINDINGS Aortic arch: Visualized aortic arch of normal caliber with normal branch pattern. No flow-limiting stenosis about the origin of the great vessels. Visualized subclavian arteries widely patent. Right carotid system: Right common and internal carotid arteries are widely patent without stenosis, dissection, or occlusion. Left carotid system: Left common and internal carotid arteries widely patent without stenosis, dissection, or occlusion. Vertebral arteries: Both vertebral arteries arise from the subclavian arteries. Left vertebral artery slightly dominant. Evaluation of a short-segment area of the proximal left V1/V2 region somewhat limited by adjacent venous contamination. Vertebral arteries otherwise widely patent without stenosis, dissection, or occlusion. Skeleton: Smooth reversal of the normal cervical lordosis, which may be related to positioning and/or muscular spasm. No listhesis or malalignment. Skull base intact. Normal C1-2 articulations are preserved in the dens is intact. Vertebral body heights maintained. No acute fracture or other osseous abnormality. No discrete osseous lesions. No significant disc pathology seen within the cervical spine. No stenosis. Other neck: No other acute soft tissue abnormality within the neck. No adenopathy. Thyroid normal. Salivary glands normal. No abnormal prevertebral edema. Upper chest: Visualized upper chest within normal limits. Visualized lungs are clear. Review of the MIP images confirms the above findings CTA HEAD FINDINGS Anterior circulation: Internal carotid arteries widely patent to the termini without stenosis. A1 segments, anterior communicating artery common anterior cerebral arteries widely patent bilaterally. M1 segments widely patent. Normal MCA bifurcations. Distal MCA branches well perfused  and symmetric. Posterior circulation: Vertebral arteries widely patent to the vertebrobasilar junction without stenosis. Posterior inferior cerebral arteries patent bilaterally. Basilar artery widely patent to its distal aspect. Superior cerebellar and posterior cerebral arteries widely patent bilaterally. Small right posterior communicating artery noted. Venous sinuses: Grossly patent, although not well assessed due to timing of the contrast bolus. Anatomic variants: None significant. Delayed phase: Not performed. Review of the MIP images confirms the above findings IMPRESSION: CT HEAD IMPRESSION Normal head CT.  No acute intracranial abnormality identified. CTA HEAD AND NECK IMPRESSION Normal CTA of the head and neck. No evidence for arterial dissection or other acute vascular abnormality. No large vessel occlusion. CT CERVICAL SPINE IMPRESSION No CT evidence for acute fracture or other osseous  abnormality within the cervical spine. Electronically Signed   By: Rise Mu M.D.   On: 04/15/2018 02:39      Subjective: Overall status improved. Reports feeling better.   Discharge Exam: Vitals:   04/16/18 0500 04/16/18 1218  BP: 111/73 129/85  Pulse: (!) 48 61  Resp: 17 16  Temp: 98.5 F (36.9 C) 98.8 F (37.1 C)  SpO2: 100% 100%   Vitals:   04/15/18 2047 04/16/18 0100 04/16/18 0500 04/16/18 1218  BP:  112/73 111/73 129/85  Pulse:  63 (!) 48 61  Resp:  17 17 16   Temp:  98.1 F (36.7 C) 98.5 F (36.9 C) 98.8 F (37.1 C)  TempSrc:  Oral Oral Oral  SpO2: 100% 100% 100% 100%  Weight:      Height:        General: Pt is alert, awake, not in acute distress Cardiovascular: RRR, S1/S2 +, no rubs, no gallops Respiratory: CTA bilaterally, no wheezing, no rhonchi Abdominal: Soft, NT, ND, bowel sounds + Extremities: no edema, no cyanosis Neuro, mild gait instability, improved since admission, otherwise unremarkable    The results of significant diagnostics from this  hospitalization (including imaging, microbiology, ancillary and laboratory) are listed below for reference.     Microbiology: No results found for this or any previous visit (from the past 240 hour(s)).   Labs: BNP (last 3 results) No results for input(s): BNP in the last 8760 hours. Basic Metabolic Panel: Recent Labs  Lab 04/15/18 0043 04/16/18 0549  NA 141 139  K 3.2* 4.4  CL 107 108  CO2 25 25  GLUCOSE 97 100*  BUN 16 18  CREATININE 0.97 1.23*  CALCIUM 9.2 9.0   Liver Function Tests: Recent Labs  Lab 04/15/18 0043  AST 27  ALT 29  ALKPHOS 70  BILITOT 0.6  PROT 7.1  ALBUMIN 4.3   No results for input(s): LIPASE, AMYLASE in the last 168 hours. No results for input(s): AMMONIA in the last 168 hours. CBC: Recent Labs  Lab 04/15/18 0043 04/16/18 0549  WBC 8.5 7.3  NEUTROABS 4.1  --   HGB 13.1 13.1  HCT 40.4 39.9  MCV 87.1 88.1  PLT 274 234   Cardiac Enzymes: No results for input(s): CKTOTAL, CKMB, CKMBINDEX, TROPONINI in the last 168 hours. BNP: Invalid input(s): POCBNP CBG: No results for input(s): GLUCAP in the last 168 hours. D-Dimer No results for input(s): DDIMER in the last 72 hours. Hgb A1c No results for input(s): HGBA1C in the last 72 hours. Lipid Profile No results for input(s): CHOL, HDL, LDLCALC, TRIG, CHOLHDL, LDLDIRECT in the last 72 hours. Thyroid function studies No results for input(s): TSH, T4TOTAL, T3FREE, THYROIDAB in the last 72 hours.  Invalid input(s): FREET3 Anemia work up No results for input(s): VITAMINB12, FOLATE, FERRITIN, TIBC, IRON, RETICCTPCT in the last 72 hours. Urinalysis    Component Value Date/Time   COLORURINE YELLOW 04/02/2017 1559   APPEARANCEUR CLEAR 04/02/2017 1559   LABSPEC 1.025 04/02/2017 1559   PHURINE 7.0 04/02/2017 1559   GLUCOSEU NEGATIVE 04/02/2017 1559   HGBUR TRACE (A) 04/02/2017 1559   BILIRUBINUR NEGATIVE 04/02/2017 1559   KETONESUR NEGATIVE 04/02/2017 1559   PROTEINUR NEGATIVE 04/02/2017  1559   NITRITE NEGATIVE 04/02/2017 1559   LEUKOCYTESUR NEGATIVE 04/02/2017 1559   Sepsis Labs Invalid input(s): PROCALCITONIN,  WBC,  LACTICIDVEN Microbiology No results found for this or any previous visit (from the past 240 hour(s)).   Time coordinating discharge: Over 30 minutes  SIGNED:  Joseph Art, DO  [facility] 04/16/2018, 3:30 PM   If 7PM-7AM, please contact night-coverage [email or web address] Password [email or web address]

## 2018-04-16 NOTE — Care Management Note (Signed)
Case Management Note  Patient Details  Name: [name] MRN: [id number] Date of Birth: [birth year]  Subjective/Objective:  22yo female presented with Neck pain, ataxia.                  Action/Plan: CM met with patient/mother to discuss transitional needs. Patient is a Electronics engineer at [employer], independent PTA. Patient verbalized PCP: [facility], [place]. CM discussed PT recommendations for outpatient PT, with patient agreeable. Patient stated utilizing [facility] in the past and requesting facility for her outpatient PT services. Patient's mother will provide transportation home. No further CM needs at this time.   Expected Discharge Date:  04/16/18               Expected Discharge Plan:  OP Rehab(Outpatient PT)  In-House Referral:  NA  Discharge planning Services  CM Consult  Post Acute Care Choice:  NA Choice offered to:  NA  DME Arranged:  N/A DME Agency:  NA  HH Arranged:  NA HH Agency:  NA  Status of Service:  Completed, signed off  If discussed at [place] of Stay Meetings, dates discussed:    Additional Comments:  Midge Minium RN, BSN, NCM-BC, ACM-RN [phone] 04/16/2018, 12:03 PM

## 2018-04-16 NOTE — Evaluation (Signed)
Occupational Therapy Evaluation Patient Details Name: Anna Fox MRN: [id number] DOB: 1996/07/17 Today's Date: 04/16/2018    History of Present Illness 22yo female who was pitching during a softball game and flung her head back during a pitching motion, felt a click in her neck with immediate L sided neck pain. Progressed to having significant difficulty with ambulation. CTA clear, CT head clear, C-spine clear, MRI of brain and C-spine both clear. PMH knee surgery, snapping hip syndrome    Clinical Impression   PTA patient independent.  Admitted for above and limited by impaired balance and ataxia, decreased L UE gross motor coordination.  She currently demonstrates ability to complete mobility with supervision (slow and guarded), transfers with supervision, LB bathing standing with supervision, tub transfers simulated with min guard and LB dressing with supervision.  Educated on safety and balance precautions (completing LB dressing seated, transferring into tub with hand held support of wall).  She will have 24/7 support of friends, reporting "we are always together", and has no further questions or concerns.  Reports slowly improving symptoms.  At this time, no further OT needs identified and OT will sign off.     Follow Up Recommendations  No OT follow up;Supervision - Intermittent    Equipment Recommendations  None recommended by OT    Recommendations for Other Services       Precautions / Restrictions Precautions Precautions: Fall Restrictions Weight Bearing Restrictions: No      Mobility Bed Mobility Overal bed mobility: Modified Independent                Transfers Overall transfer level: Needs assistance Equipment used: None Transfers: Sit to/from Stand Sit to Stand: Supervision         General transfer comment: supervision for safety    Balance Overall balance assessment: Needs assistance Sitting-balance support: Feet supported;No upper extremity  supported Sitting balance-Leahy Scale: Normal     Standing balance support: During functional activity;No upper extremity supported Standing balance-Leahy Scale: Fair Standing balance comment: min guard at times for dynamic balance tasks during simulated ADLS                            ADL either performed or assessed with clinical judgement   ADL Overall ADL's : Needs assistance/impaired     Grooming: Supervision/safety;Standing       Lower Body Bathing: Supervison/ safety;Sit to/from stand Lower Body Bathing Details (indicate cue type and reason): simulated LB bathing standing, cueing to hold onto wall to support self during 1 legged stance and having someone nearby if needed     Lower Body Dressing: Independent;Bed level;Supervision/safety Lower Body Dressing Details (indicate cue type and reason): donning socks with independence, educated on completing LB dressing from sitting position for safety due to ataxia  Toilet Transfer: Supervision/safety;Ambulation(simulated in room )       Tub/ Shower Transfer: Tub transfer;Min guard;Ambulation Tub/Shower Transfer Details (indicate cue type and reason): min guard for safety, cueing to side step over threshold supporting self on wall in order to improve safety and balance due to ataxia; pt agreeable Functional mobility during ADLs: Supervision/safety       Vision         Perception     Praxis      Pertinent Vitals/Pain Pain Assessment: 0-10 Pain Score: 5  Pain Location: neck  Pain Descriptors / Indicators: Aching;Sore Pain Intervention(s): Limited activity within patient's tolerance;Repositioned     Hand Dominance  Right   Extremity/Trunk Assessment Upper Extremity Assessment Upper Extremity Assessment: LUE deficits/detail RUE Deficits / Details: WFL, MMT 5/5, WFL sensation   LUE Deficits / Details: WFL, MMT 5/5, slight ataxia finger to nose but patient reports "its because of this (IV)", WFL sensation   LUE Coordination: decreased gross motor   Lower Extremity Assessment Lower Extremity Assessment: Defer to PT evaluation   Cervical / Trunk Assessment Cervical / Trunk Assessment: Normal   Communication Communication Communication: No difficulties   Cognition Arousal/Alertness: Awake/alert Behavior During Therapy: WFL for tasks assessed/performed Overall Cognitive Status: Within Functional Limits for tasks assessed                                     General Comments  pt reports slowly improving, improved mobility but continues to present with slow guarded mobility; educated on safety and slowly returning to daily activities    Exercises     Shoulder Instructions      Home Living Family/patient expects to be discharged to:: Private residence Living Arrangements: Non-relatives/Friends Available Help at Discharge: Friend(s);Available 24 hours/day(reports "we are always together") Type of Home: Apartment Home Access: Level entry     Home Layout: Two level Alternate Level Stairs-Number of Steps: full flight inside apartment with U railing  Alternate Level Stairs-Rails: Right Bathroom Shower/Tub: Tub/shower unit   Bathroom Toilet: Standard     Home Equipment: None          Prior Functioning/Environment Level of Independence: Independent                 OT Problem List: Impaired balance (sitting and/or standing);Decreased coordination;Decreased safety awareness;Decreased knowledge of precautions      OT Treatment/Interventions:      OT Goals(Current goals can be found in the care plan section) Acute Rehab OT Goals Patient Stated Goal: to get well  OT Goal Formulation: With patient  OT Frequency:     Barriers to D/C:            Co-evaluation              AM-PAC PT "6 Clicks" Daily Activity     Outcome Measure Help from another person eating meals?: None Help from another person taking care of personal grooming?: None Help from  another person toileting, which includes using toliet, bedpan, or urinal?: None Help from another person bathing (including washing, rinsing, drying)?: None Help from another person to put on and taking off regular upper body clothing?: None Help from another person to put on and taking off regular lower body clothing?: None 6 Click Score: 24   End of Session Nurse Communication: Mobility status  Activity Tolerance: Patient tolerated treatment well Patient left: in bed;with call bell/phone within reach;with family/visitor present  OT Visit Diagnosis: Other abnormalities of gait and mobility (R26.89)                Time: 1610-9604 OT Time Calculation (min): 8 min Charges:  OT General Charges $OT Visit: 1 Visit OT Evaluation $OT Eval Low Complexity: 1 Low  Chancy Milroy, OT Acute Rehabilitation Services Pager [phone] Office [phone]   Chancy Milroy 04/16/2018, 8:24 AM

## 2018-04-16 NOTE — Progress Notes (Signed)
Physical Therapy Treatment Patient Details Name: Anna Fox MRN: [id number] DOB: 05/16/1996 Today's Date: 04/16/2018    History of Present Illness 22yo female who was pitching during a softball game and flung her head back during a pitching motion, felt a click in her neck with immediate L sided neck pain. Progressed to having significant difficulty with ambulation. CTA clear, CT head clear, C-spine clear, MRI of brain and C-spine both clear. PMH knee surgery, snapping hip syndrome     PT Comments    Patient reporting improvement in neck pain, however, demonstrating decreased range of motion with left lateral flexion and rotation with noted right upper trapezius tightness. Continues with gait abnormalities including decreased gait speed for age and mildly ataxic gait. Scoring 20/24 on Dynamic Gait Index, indicating she is not at risk for falls but does have dynamic balance impairments. Able to negotiate 10 steps with railing and supervision. Continuing to recommend OPPT to address deficits.     Follow Up Recommendations  Outpatient PT((neuro))     Equipment Recommendations  None recommended by PT    Recommendations for Other Services       Precautions / Restrictions Precautions Precautions: Fall Restrictions Weight Bearing Restrictions: No    Mobility  Bed Mobility Overal bed mobility: Independent                Transfers Overall transfer level: Independent Equipment used: None                Ambulation/Gait Ambulation/Gait assistance: Supervision Gait Distance (Feet): 350 Feet Assistive device: None Gait Pattern/deviations: Step-through pattern;Decreased stride length;Ataxic;Narrow base of support Gait velocity: decreased    General Gait Details: Continues with slow cadence and mild ataxia. Noted occasional scissoring   Stairs Stairs: Yes Stairs assistance: Supervision Stair Management: One rail Right Number of Stairs: 10 General stair  comments: Step over step pattern. Pt reported being "wobbly."   Wheelchair Mobility    Modified Rankin (Stroke Patients Only)       Balance Overall balance assessment: Needs assistance Sitting-balance support: Feet supported;No upper extremity supported Sitting balance-Leahy Scale: Normal     Standing balance support: During functional activity;No upper extremity supported Standing balance-Leahy Scale: Good                   Standardized Balance Assessment Standardized Balance Assessment : Dynamic Gait Index   Dynamic Gait Index Level Surface: Mild Impairment Change in Gait Speed: Mild Impairment Gait with Horizontal Head Turns: Normal Gait with Vertical Head Turns: Normal Gait and Pivot Turn: Mild Impairment Step Over Obstacle: Normal Step Around Obstacles: Normal Steps: Mild Impairment Total Score: 20      Cognition Arousal/Alertness: Awake/alert Behavior During Therapy: WFL for tasks assessed/performed Overall Cognitive Status: Within Functional Limits for tasks assessed                                        Exercises      General Comments        Pertinent Vitals/Pain Pain Assessment: Faces Faces Pain Scale: Hurts a little bit Pain Location: neck  Pain Descriptors / Indicators: Other (Comment)("stiff") Pain Intervention(s): Monitored during session    Home Living                      Prior Function            PT Goals (current  goals can now be found in the care plan section) Acute Rehab PT Goals Patient Stated Goal: to get well  Potential to Achieve Goals: Good Progress towards PT goals: Progressing toward goals    Frequency    Min 4X/week      PT Plan Current plan remains appropriate    Co-evaluation              AM-PAC PT "6 Clicks" Daily Activity  Outcome Measure  Difficulty turning over in bed (including adjusting bedclothes, sheets and blankets)?: None Difficulty moving from lying on  back to sitting on the side of the bed? : None Difficulty sitting down on and standing up from a chair with arms (e.g., wheelchair, bedside commode, etc,.)?: None Help needed moving to and from a bed to chair (including a wheelchair)?: None Help needed walking in hospital room?: A Little Help needed climbing 3-5 steps with a railing? : A Little 6 Click Score: 22    End of Session   Activity Tolerance: Patient tolerated treatment well Patient left: in bed;with family/visitor present;with call bell/phone within reach Nurse Communication: Mobility status PT Visit Diagnosis: Unsteadiness on feet (R26.81);Other abnormalities of gait and mobility (R26.89);Ataxic gait (R26.0);Muscle weakness (generalized) (M62.81)     Time: 9629-5284 PT Time Calculation (min) (ACUTE ONLY): 12 min  Charges:  $Therapeutic Activity: 8-22 mins                    Laurina Bustle, PT, DPT Acute Rehabilitation Services Pager [phone] Office [phone]    Vanetta Mulders 04/16/2018, 12:59 PM

## 2018-07-29 ENCOUNTER — Ambulatory Visit: Payer: BLUE CROSS/BLUE SHIELD | Admitting: Neurology

## 2018-07-29 ENCOUNTER — Encounter: Payer: Self-pay | Admitting: Neurology

## 2018-07-29 VITALS — BP 122/8 | HR 73 | Ht 66.0 in | Wt 163.0 lb

## 2018-07-29 DIAGNOSIS — R270 Ataxia, unspecified: Secondary | ICD-10-CM | POA: Diagnosis not present

## 2018-07-29 NOTE — Progress Notes (Signed)
Reason for visit: Ataxia, walking problem  Referring physician: Dr. [name] is a 23 y.o. female  History of present illness:  Anna Fox is a 23 year old white female with a history of a sudden onset injury to the neck that occurred on 14 April 2018.  The patient was pitching softball, she tilted her head back suddenly, and began having severe neck pain.  The patient was able to walk over to the bench and sit down, but from there she was not able to walk well.  The patient was taken to the hospital and underwent an extensive work-up that included a CT angiogram of the head and neck, cervical CT scan, MRI of the brain, cervical spine, and thoracic spine.  All of these studies were normal.  The patient continued to have some neck pain for about 2 months following this, but she has since recovered completely, she is back to full normal activities.  She reports no numbness, weakness, difficulty controlling bowels or the bladder, she denies any gait instability.  She has no significant neck discomfort at this point.  She did not experience any vertigo or dizziness with the episode.  The patient is sent to this office for an evaluation.  Past Medical History:  Diagnosis Date  . Asthma   . Snapping hip syndrome     Past Surgical History:  Procedure Laterality Date  . KNEE SURGERY     X 2 Menisucs     No family history on file.  Social history:  reports that she has never smoked. She has never used smokeless tobacco. She reports that she does not drink alcohol or use drugs.  Medications:  Prior to Admission medications   Medication Sig Start Date End Date Taking? Authorizing Provider  albuterol (PROAIR HFA) 108 (90 Base) MCG/ACT inhaler Inhale 2 puffs into the lungs every 6 (six) hours as needed for wheezing. 01/09/17  Yes [provider]  Norethindrone Acetate-Ethinyl Estradiol (JUNEL 1.5/30) 1.5-30 MG-MCG tablet Take 1 tablet by mouth daily. 01/09/17  Yes  [provider]  UNABLE TO FIND Outpatient physical therapy Dx: ataxia Evaluate and treat 04/16/18  Yes Marlin Canary U, DO  Fluticasone-Salmeterol (ADVAIR) 250-50 MCG/DOSE AEPB Inhale into the lungs 2 (two) times daily. 06/25/17 06/25/18  [provider]     No Known Allergies  ROS:  Out of a complete 14 system review of symptoms, the patient complains only of the following symptoms, and all other reviewed systems are negative.  Walking problem, neck pain  Blood pressure (!) 122/8, pulse 73, height 5\' 6"  (1.676 m), weight 163 lb (73.9 kg), SpO2 99 %.  Physical Exam  General: The patient is alert and cooperative at the time of the examination.  Eyes: Pupils are equal, round, and reactive to light. Discs are flat bilaterally.  Neck: The neck is supple, no carotid bruits are noted.  Respiratory: The respiratory examination is clear.  Cardiovascular: The cardiovascular examination reveals a regular rate and rhythm, no obvious murmurs or rubs are noted.  Neuromuscular: Range move the neck is full and normal.  Skin: Extremities are without significant edema.  Neurologic Exam  Mental status: The patient is alert and oriented x 3 at the time of the examination. The patient has apparent normal recent and remote memory, with an apparently normal attention span and concentration ability.  Cranial nerves: Facial symmetry is present. There is good sensation of the face to pinprick and soft touch bilaterally. The strength of  the facial muscles and the muscles to head turning and shoulder shrug are normal bilaterally. Speech is well enunciated, no aphasia or dysarthria is noted. Extraocular movements are full. Visual fields are full. The tongue is midline, and the patient has symmetric elevation of the soft palate. No obvious hearing deficits are noted.  Motor: The motor testing reveals 5 over 5 strength of all 4 extremities. Good symmetric motor tone is noted  throughout.  Sensory: Sensory testing is intact to pinprick, soft touch, vibration sensation, and position sense on all 4 extremities. No evidence of extinction is noted.  Coordination: Cerebellar testing reveals good finger-nose-finger and heel-to-shin bilaterally.  Gait and station: Gait is normal. Tandem gait is normal. Romberg is negative. No drift is seen.  Reflexes: Deep tendon reflexes are symmetric and normal bilaterally. Toes are downgoing bilaterally.   MRI brain 04/15/18:  IMPRESSION: Normal brain MRI.  No acute intracranial abnormality identified.   MRI cervical 04/15/18:  IMPRESSION: Normal MRI of the cervical spine. No acute traumatic injury identified. No significant disc pathology, stenosis, or evidence for neural impingement.   MRI thoracic 04/15/18:  IMPRESSION: Negative exam.  No cause is seen for the reported symptoms.   CTA head and neck 04/15/18:  IMPRESSION: CT HEAD IMPRESSION  Normal head CT.  No acute intracranial abnormality identified.  CTA HEAD AND NECK IMPRESSION  Normal CTA of the head and neck. No evidence for arterial dissection or other acute vascular abnormality. No large vessel occlusion.  CT CERVICAL SPINE IMPRESSION  No CT evidence for acute fracture or other osseous abnormality within the cervical spine.    Assessment/Plan:  1.  Acute cervical strain, resolved  The patient is doing well at this time, neurologic examination is completely normal.  The patient may return to and maintain full normal activity.  She will follow-up here if needed.  Marlan Palau. Keith Malayzia Laforte MD 07/29/2018 8:45 AM  [facility] [address] [place] [id number]  Phone [phone] Fax [phone]

## 2019-03-01 ENCOUNTER — Other Ambulatory Visit: Payer: Self-pay | Admitting: Family

## 2019-03-01 DIAGNOSIS — Z20822 Contact with and (suspected) exposure to covid-19: Secondary | ICD-10-CM

## 2019-03-02 ENCOUNTER — Other Ambulatory Visit: Payer: Self-pay | Admitting: Hematology

## 2019-03-02 ENCOUNTER — Other Ambulatory Visit: Payer: Self-pay

## 2019-03-02 DIAGNOSIS — Z20822 Contact with and (suspected) exposure to covid-19: Secondary | ICD-10-CM

## 2019-03-04 LAB — NOVEL CORONAVIRUS, NAA: SARS-CoV-2, NAA: NOT DETECTED

## 2019-09-26 ENCOUNTER — Encounter (HOSPITAL_COMMUNITY): Payer: Self-pay

## 2019-09-26 ENCOUNTER — Other Ambulatory Visit: Payer: Self-pay

## 2019-09-26 ENCOUNTER — Emergency Department (HOSPITAL_COMMUNITY)
Admission: EM | Admit: 2019-09-26 | Discharge: 2019-09-27 | Disposition: A | Discharge status: Home / Self Care | Payer: BC Managed Care – PPO | Attending: Emergency Medicine | Admitting: Emergency Medicine

## 2019-09-26 DIAGNOSIS — Z79899 Other long term (current) drug therapy: Secondary | ICD-10-CM | POA: Diagnosis not present

## 2019-09-26 DIAGNOSIS — Y999 Unspecified external cause status: Secondary | ICD-10-CM | POA: Insufficient documentation

## 2019-09-26 DIAGNOSIS — W540XXA Bitten by dog, initial encounter: Secondary | ICD-10-CM | POA: Insufficient documentation

## 2019-09-26 DIAGNOSIS — Y939 Activity, unspecified: Secondary | ICD-10-CM | POA: Insufficient documentation

## 2019-09-26 DIAGNOSIS — J45909 Unspecified asthma, uncomplicated: Secondary | ICD-10-CM | POA: Insufficient documentation

## 2019-09-26 DIAGNOSIS — Z23 Encounter for immunization: Secondary | ICD-10-CM | POA: Diagnosis not present

## 2019-09-26 DIAGNOSIS — Y929 Unspecified place or not applicable: Secondary | ICD-10-CM | POA: Diagnosis not present

## 2019-09-26 DIAGNOSIS — S61254A Open bite of right ring finger without damage to nail, initial encounter: Secondary | ICD-10-CM | POA: Insufficient documentation

## 2019-09-26 MED ORDER — HYDROCODONE-ACETAMINOPHEN 5-325 MG PO TABS
1.0000 | ORAL_TABLET | Freq: Once | ORAL | Status: AC
  Administered 2019-09-27: 1 via ORAL
  Filled 2019-09-26: qty 1

## 2019-09-26 MED ORDER — AMOXICILLIN-POT CLAVULANATE 875-125 MG PO TABS
1.0000 | ORAL_TABLET | Freq: Once | ORAL | Status: AC
  Administered 2019-09-27: 1 via ORAL
  Filled 2019-09-26: qty 1

## 2019-09-26 MED ORDER — TETANUS-DIPHTH-ACELL PERTUSSIS 5-2.5-18.5 LF-MCG/0.5 IM SUSP
0.5000 mL | Freq: Once | INTRAMUSCULAR | Status: AC
  Administered 2019-09-27: 0.5 mL via INTRAMUSCULAR
  Filled 2019-09-26: qty 0.5

## 2019-09-26 NOTE — ED Triage Notes (Signed)
Patient arrived stating about 20 minutes ago her dogs were in a fight and she got bit trying to break it up. Injury to finger on right hand. Bleeding is controlled. Reports dogs are up to date on their shots.

## 2019-09-27 ENCOUNTER — Emergency Department (HOSPITAL_COMMUNITY): Payer: BC Managed Care – PPO

## 2019-09-27 MED ORDER — IBUPROFEN 600 MG PO TABS: 600.0000 mg | ORAL_TABLET | Freq: Four times a day (QID) | ORAL | 0 refills | Status: DC | PRN

## 2019-09-27 MED ORDER — AMOXICILLIN-POT CLAVULANATE 875-125 MG PO TABS: 1.0000 | ORAL_TABLET | Freq: Two times a day (BID) | ORAL | 0 refills | Status: DC

## 2019-09-27 NOTE — ED Notes (Signed)
Patient ambulated to X-ray 

## 2019-09-27 NOTE — Discharge Instructions (Signed)
Your x-ray did not show any evidence of fracture or bony injury.  Take Augmentin as prescribed until finished.  We recommend 600 mg ibuprofen every 6 hours for pain.  You may take this with Tylenol as needed.  Keep your wound clean and dry.  Do not submerge in dirty water such as while taking a bath or doing dishes.  You may shower normally.  Have your wound rechecked by a primary doctor.  You may return for new or concerning symptoms.

## 2019-09-27 NOTE — ED Notes (Signed)
[place] signature pad unavailable to hallway beds. Patient verbalized understanding of discharge instructions.  

## 2019-09-27 NOTE — ED Provider Notes (Signed)
[facility]-EMERGENCY DEPT Provider Note   CSN: [id number] Arrival date & time: 09/26/19  2205     History Chief Complaint  Patient presents with  . Animal Bite    Anna Fox is a 24 y.o. female.  24 year old female presents to the emergency department for evaluation of dog bite to her right ring finger.  States that she was trying to break up a dog fight when she was bit.  She has been having some swelling as well as constant pain to the affected digit.  Pain aggravated with ROM.  Some bleeding noted from laceration which resolved spontaneously prior to arrival.  No medications taken at home.  Patient unsure of her last tetanus shot.  No associated numbness, paresthesias, weakness.  The history is provided by the patient. No language interpreter was used.  Animal Bite      Past Medical History:  Diagnosis Date  . Asthma   . Snapping hip syndrome     Patient Active Problem List   Diagnosis Date Noted  . 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 Asthma in adult, moderate persistent, uncomplicated 04/15/2018  . Neck pain, musculoskeletal 04/15/2018    Past Surgical History:  Procedure Laterality Date  . KNEE SURGERY     X 2 Menisucs      OB History   No obstetric history on file.     No family history on file.  Social History   Tobacco Use  . Smoking status: Never Smoker  . Smokeless tobacco: Never Used  Substance Use Topics  . Alcohol use: No  . Drug use: No    Home Medications Prior to Admission medications   Medication Sig Start Date End Date Taking? Authorizing Provider  albuterol (PROAIR HFA) 108 (90 Base) MCG/ACT inhaler Inhale 2 puffs into the lungs every 6 (six) hours as needed for wheezing. 01/09/17   [provider]  amoxicillin-clavulanate (AUGMENTIN) 875-125 MG tablet Take 1 tablet by mouth every 12 (twelve) hours. 09/27/19   Antony Madura, PA-C  Fluticasone-Salmeterol (ADVAIR) 250-50 MCG/DOSE AEPB Inhale  into the lungs 2 (two) times daily. 06/25/17 06/25/18  [provider]  ibuprofen (ADVIL) 600 MG tablet Take 1 tablet (600 mg total) by mouth every 6 (six) hours as needed. 09/27/19   Antony Madura, PA-C  Norethindrone Acetate-Ethinyl Estradiol (JUNEL 1.5/30) 1.5-30 MG-MCG tablet Take 1 tablet by mouth daily. 01/09/17   [provider]  UNABLE TO FIND Outpatient physical therapy Dx: ataxia Evaluate and treat 04/16/18   Joseph Art, DO    Allergies    Patient has no known allergies.  Review of Systems   Review of Systems  Ten systems reviewed and are negative for acute change, except as noted in the HPI.    Physical Exam Updated Vital Signs BP (!) 144/92 (BP Location: Left Arm)   Pulse 99   Temp 98.4 F (36.9 C) (Oral)   Resp 18   LMP 09/05/2019   SpO2 100%   Physical Exam Vitals and nursing note reviewed.  Constitutional:      General: She is not in acute distress.    Appearance: She is well-developed. She is not diaphoretic.     Comments: Nontoxic-appearing and in no distress  HENT:     Head: Normocephalic and atraumatic.  Eyes:     General: No scleral icterus.    Conjunctiva/sclera: Conjunctivae normal.  Cardiovascular:     Comments: Capillary refill brisk in all digits of the right  hand Pulmonary:     Effort: Pulmonary effort is normal. No respiratory distress.  Musculoskeletal:        General: Normal range of motion.     Cervical back: Normal range of motion.     Comments: Very small lacerations to the fat pad of the right ring finger with preserved approximation and no exposed subcutaneous fat.  5/5 strength against resistance of FDP, FDS, and extensors of the affected digit.  Mild associated swelling to the distal right fourth finger.  Skin:    General: Skin is warm and dry.     Coloration: Skin is not pale.     Findings: No erythema or rash.  Neurological:     Mental Status: She is alert and oriented to person, place, and time.      Coordination: Coordination normal.  Psychiatric:        Behavior: Behavior normal.     ED Results / Procedures / Treatments   Labs (all labs ordered are listed, but only abnormal results are displayed) Labs Reviewed - No data to display  EKG None  Radiology DG Finger Ring Right  Result Date: 09/27/2019 CLINICAL DATA:  Dog bite EXAM: RIGHT RING FINGER 2+V COMPARISON:  None. FINDINGS: There is no evidence of fracture or dislocation. There is no evidence of arthropathy or other focal bone abnormality. Soft tissues are unremarkable. IMPRESSION: Negative. Electronically Signed   By: Constance Holster M.D.   On: 09/27/2019 00:27    Procedures Procedures (including critical care time)  Medications Ordered in ED Medications  HYDROcodone-acetaminophen (NORCO/VICODIN) 5-325 MG per tablet 1 tablet (1 tablet Oral Given 09/27/19 0022)  amoxicillin-clavulanate (AUGMENTIN) 875-125 MG per tablet 1 tablet (1 tablet Oral Given 09/27/19 0021)  Tdap (BOOSTRIX) injection 0.5 mL (0.5 mLs Intramuscular Given 09/27/19 0022)    ED Course  I have reviewed the triage vital signs and the nursing notes.  Pertinent labs & imaging results that were available during my care of the patient were reviewed by me and considered in my medical decision making (see chart for details).    MDM Rules/Calculators/A&P                      24 year old female presents for animal bite to her right ring finger sustained prior to arrival.  Wounds are well approximated and the patient is neurovascularly intact.  Discussed that closure would increase risk of infection.  Do not see indication for repair today.  Her tetanus was updated in the ED.  She states that both dogs are up-to-date on their vaccinations.  Will discharge on course of Augmentin.  Advised wound care with her PCP in 2 days.  Return precautions discussed and provided. Patient discharged in stable condition with no unaddressed concerns.   Final Clinical Impression(s)  / ED Diagnoses Final diagnoses:  Open wound of right ring finger due to dog bite    Rx / DC Orders ED Discharge Orders         Ordered    amoxicillin-clavulanate (AUGMENTIN) 875-125 MG tablet  Every 12 hours     09/27/19 0049    ibuprofen (ADVIL) 600 MG tablet  Every 6 hours PRN     09/27/19 0049           Antonietta Breach, PA-C 09/27/19 0102    Orpah Greek, MD 09/27/19 [phone]

## 2020-03-22 ENCOUNTER — Ambulatory Visit
Admission: EM | Admit: 2020-03-22 | Discharge: 2020-03-22 | Disposition: A | Discharge status: Home / Self Care | Payer: BC Managed Care – PPO | Attending: Emergency Medicine | Admitting: Emergency Medicine

## 2020-03-22 ENCOUNTER — Other Ambulatory Visit: Payer: Self-pay

## 2020-03-22 DIAGNOSIS — J45901 Unspecified asthma with (acute) exacerbation: Secondary | ICD-10-CM

## 2020-03-22 DIAGNOSIS — J029 Acute pharyngitis, unspecified: Secondary | ICD-10-CM

## 2020-03-22 LAB — URINALYSIS, COMPLETE (UACMP) WITH MICROSCOPIC
Bilirubin Urine: NEGATIVE
Glucose, UA: NEGATIVE mg/dL
Ketones, ur: 15 mg/dL — AB
Leukocytes,Ua: NEGATIVE
Nitrite: NEGATIVE
Protein, ur: NEGATIVE mg/dL
Specific Gravity, Urine: 1.02 (ref 1.005–1.030)
pH: 6.5 (ref 5.0–8.0)

## 2020-03-22 LAB — GROUP A STREP BY PCR: Group A Strep by PCR: NOT DETECTED

## 2020-03-22 LAB — PREGNANCY, URINE: Preg Test, Ur: NEGATIVE

## 2020-03-22 MED ORDER — ACETAMINOPHEN 500 MG PO TABS
1000.0000 mg | ORAL_TABLET | Freq: Once | ORAL | Status: AC
  Administered 2020-03-22: 1000 mg via ORAL

## 2020-03-22 MED ORDER — AEROCHAMBER PLUS MISC: 2 refills | Status: AC

## 2020-03-22 MED ORDER — IBUPROFEN 600 MG PO TABS: 600.0000 mg | ORAL_TABLET | Freq: Four times a day (QID) | ORAL | 0 refills | Status: DC | PRN

## 2020-03-22 MED ORDER — DEXAMETHASONE SODIUM PHOSPHATE 10 MG/ML IJ SOLN
10.0000 mg | Freq: Once | INTRAMUSCULAR | Status: AC
  Administered 2020-03-22: 10 mg via INTRAMUSCULAR

## 2020-03-22 MED ORDER — IBUPROFEN 800 MG PO TABS
800.0000 mg | ORAL_TABLET | Freq: Once | ORAL | Status: AC
  Administered 2020-03-22: 800 mg via ORAL

## 2020-03-22 NOTE — Discharge Instructions (Addendum)
Your strep PCR was negative today.  Dexamethasone will help with your asthma.  Use the spacer with your albuterol inhaler.  1000 mg of Tylenol and 600 mg ibuprofen together 3-4 times a day as needed for pain.  Make sure you drink plenty of extra fluids.  Some people find salt water gargles and  Traditional Medicinal's "Throat Coat" tea helpful. Take 5 mL of liquid Benadryl and 5 mL of Maalox. Mix it together, and then hold it in your mouth for as long as you can and then swallow. You may do this 4 times a day.    Go to www.goodrx.com to look up your medications. This will give you a list of where you can find your prescriptions at the most affordable prices. Or ask the pharmacist what the cash price is, or if they have any other discount programs available to help make your medication more affordable. This can be less expensive than what you would pay with insurance.

## 2020-03-22 NOTE — ED Provider Notes (Signed)
HPI  SUBJECTIVE:  Patient reports sore throat starting today. Sx worse with swallowing.  Sx better with ibuprofen 600 mg, DayQuil, Chloraseptic.  States that her asthma is also bothering her. + Fever tmax 100.3 + Swollen neck glands   No neck stiffness  + Occasional cough No nasal congestion, rhinorrhea + Myalgias + Headache No Rash  No loss of taste or smell + shortness of breath-states that she woke up 4 times last night with shortness of breath and wheezing, required her albuterol inhaler.   No nausea, vomiting No diarrhea No abdominal pain today     No Recent Strep, mono No reflux sxs No Allergy sxs  No Breathing difficulty, voice changes, sensation of throat swelling shut No Drooling No Trismus No abx in past month.  No antipyretic in past 4-6 hrs Past medical history of Covid several weeks ago.  Had a recent negative Covid test.  She has asthma, normally requires her albuterol inhaler as needed.  No history of frequent strep, mono, diabetes, hypertension.  LMP: "A couple months ago.  Unsure if she could be pregnant.  PMD: Dr. Carren Rang   Past Medical History:  Diagnosis Date  . Asthma   . Snapping hip syndrome     Past Surgical History:  Procedure Laterality Date  . KNEE SURGERY     X 2 Menisucs     Family History  Problem Relation Age of Onset  . Healthy Mother   . Healthy Father     Social History   Tobacco Use  . Smoking status: Never Smoker  . Smokeless tobacco: Never Used  Vaping Use  . Vaping Use: Never used  Substance Use Topics  . Alcohol use: No  . Drug use: No     Current Facility-Administered Medications:  .  dexamethasone (DECADRON) injection 10 mg, 10 mg, Intramuscular, Once, Domenick Gong, MD .  ibuprofen (ADVIL) tablet 800 mg, 800 mg, Oral, Once, Domenick Gong, MD  Current Outpatient Medications:  .  albuterol (PROAIR HFA) 108 (90 Base) MCG/ACT inhaler, Inhale 2 puffs into the lungs every 6 (six) hours as needed  for wheezing., Disp: , Rfl:  .  Fluticasone-Salmeterol (ADVAIR) 250-50 MCG/DOSE AEPB, Inhale into the lungs 2 (two) times daily., Disp: , Rfl:  .  ibuprofen (ADVIL) 600 MG tablet, Take 1 tablet (600 mg total) by mouth every 6 (six) hours as needed., Disp: 30 tablet, Rfl: 0 .  Norethindrone Acetate-Ethinyl Estradiol (JUNEL 1.5/30) 1.5-30 MG-MCG tablet, Take 1 tablet by mouth daily., Disp: , Rfl:  .  Spacer/Aero-Holding Chambers (AEROCHAMBER PLUS) inhaler, Use as instructed, Disp: 1 each, Rfl: 2 .  UNABLE TO FIND, Outpatient physical therapy Dx: ataxia Evaluate and treat, Disp: 1 each, Rfl: 0  No Known Allergies   ROS  As noted in HPI.   Physical Exam  BP (!) 142/98 (BP Location: Left Arm)   Pulse (!) 114   Temp (!) 100.7 F (38.2 C) (Oral)   Resp 18   Ht 5\' 6"  (1.676 m)   Wt 65 kg   SpO2 99%   BMI 23.13 kg/m   Constitutional: Well developed, well nourished, no acute distress Eyes:  EOMI, conjunctiva normal bilaterally HENT: Normocephalic, atraumatic,mucus membranes moist. - nasal congestion +  erythematous oropharynx + enlarged tonsils - exudates. Uvula midline.  positive cobblestoning, no postnasal drip.  Respiratory: Normal inspiratory effort, lungs clear bilaterally Cardiovascular: Normal rate, no murmurs, rubs, gallops GI: nondistended, nontender. No appreciable splenomegaly skin: No rash, skin intact Lymph:  + Anterior  cervical LN.  No posterior cervical lymphadenopathy Musculoskeletal: no deformities Neurologic: Alert & oriented x 3, no focal neuro deficits Psychiatric: Speech and behavior appropriate.  ED Course   Medications  dexamethasone (DECADRON) injection 10 mg (has no administration in time range)  ibuprofen (ADVIL) tablet 800 mg (has no administration in time range)  acetaminophen (TYLENOL) tablet 1,000 mg (1,000 mg Oral Given 03/22/20 2057)    Orders Placed This Encounter  Procedures  . Group A Strep by PCR    Standing Status:   Standing    Number of  Occurrences:   1  . Pregnancy, urine    Standing Status:   Standing    Number of Occurrences:   1  . Urinalysis, Complete w Microscopic    Standing Status:   Standing    Number of Occurrences:   1    Results for orders placed or performed during the hospital encounter of 03/22/20 (from the past 24 hour(s))  Group A Strep by PCR     Status: None   Collection Time: 03/22/20  8:38 PM   Specimen: Throat; Sterile Swab  Result Value Ref Range   Group A Strep by PCR NOT DETECTED NOT DETECTED  Pregnancy, urine     Status: None   Collection Time: 03/22/20  8:53 PM  Result Value Ref Range   Preg Test, Ur NEGATIVE NEGATIVE  Urinalysis, Complete w Microscopic Urine, Clean Catch     Status: Abnormal   Collection Time: 03/22/20  8:53 PM  Result Value Ref Range   Color, Urine YELLOW YELLOW   APPearance CLEAR CLEAR   Specific Gravity, Urine 1.020 1.005 - 1.030   pH 6.5 5.0 - 8.0   Glucose, UA NEGATIVE NEGATIVE mg/dL   Hgb urine dipstick TRACE (A) NEGATIVE   Bilirubin Urine NEGATIVE NEGATIVE   Ketones, ur 15 (A) NEGATIVE mg/dL   Protein, ur NEGATIVE NEGATIVE mg/dL   Nitrite NEGATIVE NEGATIVE   Leukocytes,Ua NEGATIVE NEGATIVE   Squamous Epithelial / LPF 6-10 0 - 5   WBC, UA 0-5 0 - 5 WBC/hpf   RBC / HPF 0-5 0 - 5 RBC/hpf   Bacteria, UA FEW (A) NONE SEEN   Mucus PRESENT    No results found.  ED Clinical Impression  1. Viral pharyngitis   2. Mild asthma with acute exacerbation, unspecified whether persistent      ED Assessment/Plan  Strep PCR negative.  Urine pregnancy negative.  Patient gave is a contaminated urine specimen, do not think that she has a UTI.  Giving 1000 g of Tylenol, 800 mg of ibuprofen and 10 mg of dexamethasone for pain and swelling.  The dexamethasone will also help with her asthma-believe that she is having an asthma flare.  Presentation consistent with a viral pharyngitis.  Deferring repeat testing for Covid since she had it less than a month ago.. Patient home  with ibuprofen, Tylenol, Benadryl/Maalox mixture.scrubbing in a spacer to go with her inhaler.  Patient to followup with PMD when necessary, to the ER if she gets worse.  Discussed labs, treatment plan, medical decision-making, plan for follow-up with patient.  Discussed signs and symptoms that should prompt return to the emergency department.  Patient agrees with plan.   Meds ordered this encounter  Medications  . acetaminophen (TYLENOL) tablet 1,000 mg  . dexamethasone (DECADRON) injection 10 mg  . ibuprofen (ADVIL) tablet 800 mg  . ibuprofen (ADVIL) 600 MG tablet    Sig: Take 1 tablet (600 mg total) by mouth every  6 (six) hours as needed.    Dispense:  30 tablet    Refill:  0  . Spacer/Aero-Holding Chambers (AEROCHAMBER PLUS) inhaler    Sig: Use as instructed    Dispense:  1 each    Refill:  2     *This clinic note was created using Dragon dictation software. Therefore, there may be occasional mistakes despite careful proofreading.    Domenick Gong, MD 03/22/20 2117

## 2020-03-22 NOTE — ED Triage Notes (Signed)
Patient complains of sore throat, fever, chills and body aches. States that this started this morning.

## 2021-01-12 IMAGING — CR DG FINGER RING 2+V*R*
3 series · 3 of 3 positions shown · non-contrast
Comparison: None.

CLINICAL DATA: Dog bite

EXAM:
RIGHT RING FINGER 2+V

[x finger pa right]
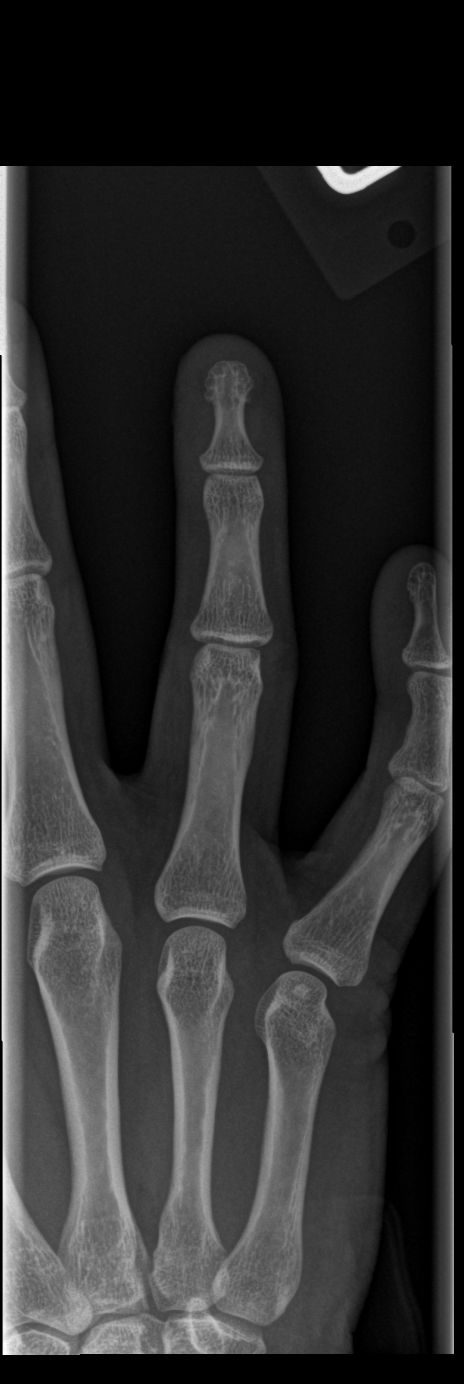

[x finger obl right]
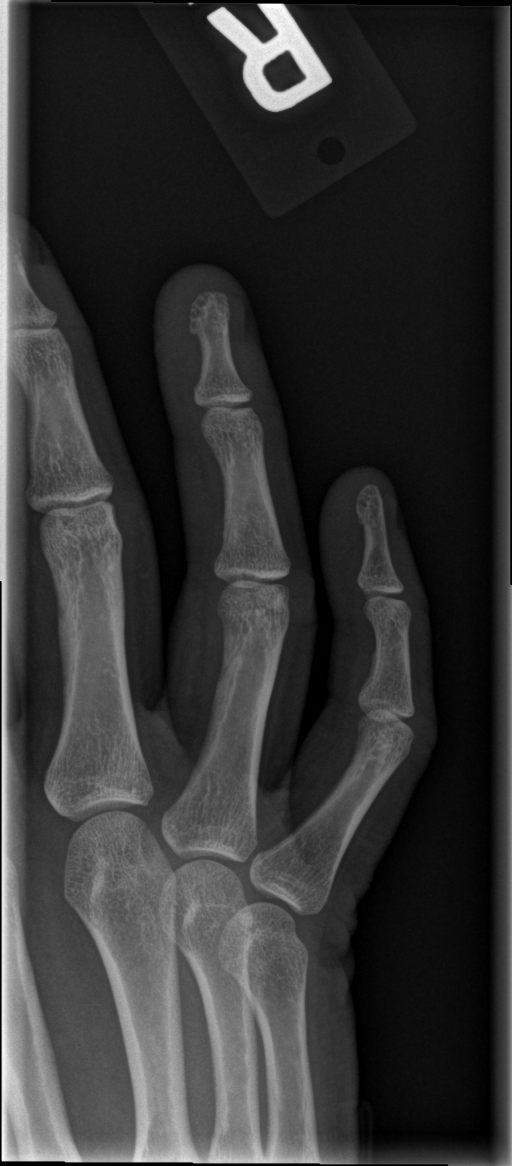

[x finger lat right]
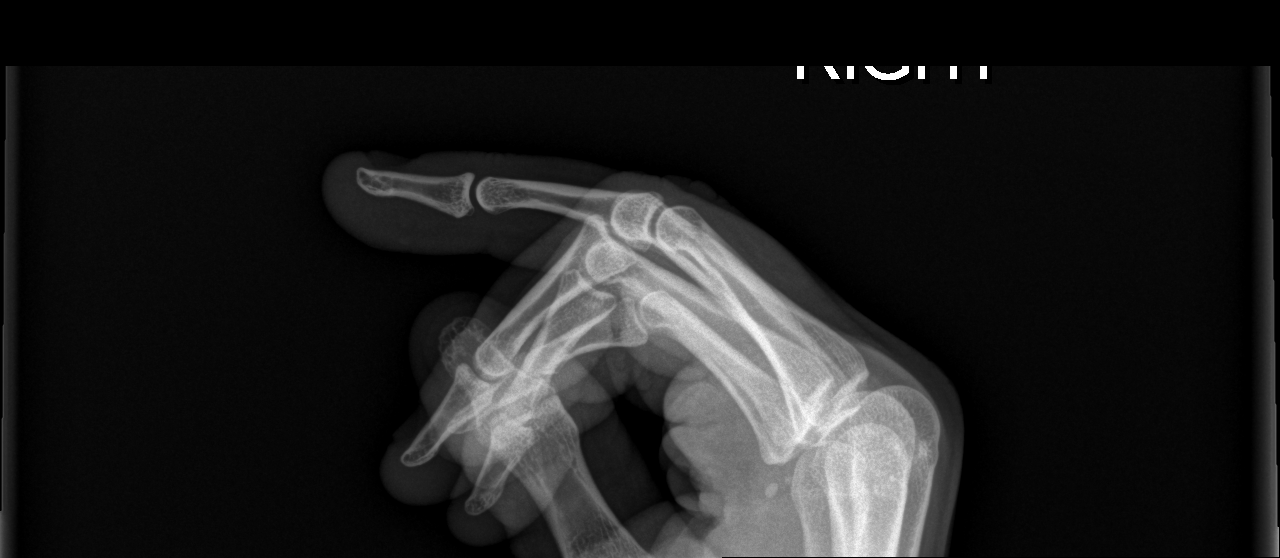

[3 of 3 positions shown; findings below may reference images not displayed]

FINDINGS: There is no evidence of fracture or dislocation. There is no
evidence of arthropathy or other focal bone abnormality. Soft
tissues are unremarkable.
IMPRESSION: Negative.

## 2021-06-27 ENCOUNTER — Ambulatory Visit (LOCAL_COMMUNITY_HEALTH_CENTER): Payer: Self-pay

## 2021-06-27 ENCOUNTER — Other Ambulatory Visit: Payer: Self-pay

## 2021-06-27 VITALS — BP 142/79 | Ht 66.0 in | Wt 140.0 lb

## 2021-06-27 DIAGNOSIS — Z3201 Encounter for pregnancy test, result positive: Secondary | ICD-10-CM

## 2021-06-27 LAB — PREGNANCY, URINE: Preg Test, Ur: POSITIVE — AB

## 2021-06-27 MED ORDER — PRENATAL 27-0.8 MG PO TABS: 1.0000 | ORAL_TABLET | Freq: Every day | ORAL | 0 refills | Status: AC

## 2021-06-27 NOTE — Progress Notes (Signed)
UPT positive. Plans prenatal care at [facility]. BP elevated today 142/79 and 15 min later, BP 125/83. Pt reports stress from death of grandfather months ago. Hx asthma and takes Singulair daily plus inhaler which was prescribed by [facility].  Pt explains she experiences more SOB since preg. Consult Elveria Rising, FNP who advises that Singulair is safe in preg and for  pt to establish prenatal care ASAP. Pt counseled  to seek immediate med attn/go to ER if increased SOB, difficulty breathing, chest pain. RN discussed provider recommendations and pt in agreement. PHQ -11 today. Declines to speak to provider. Pt states she is in safe situation and has no plans to hurt self. Accepts Hazeline Junker, LCSW card.  To DSS for medicaid/preg women. Jerel Shepherd, RN

## 2021-06-28 NOTE — Progress Notes (Signed)
Consulted by RN re: patient situation.  Reviewed RN note and agree that it reflects our discussion and my recommendations.  ° ° °Zyler Hyson, FNP  °

## 2021-07-22 NOTE — L&D Delivery Note (Signed)
Delivery Summary for Anna Fox  Labor Events:   Preterm labor: No data found  Rupture date: 02/06/2022  Rupture time: 9:50 PM  Rupture type: Artificial  Fluid Color: Clear  Induction: No data found  Augmentation: No data found  Complications: No data found  Cervical ripening: No data found No data found   No data found     Delivery:   Episiotomy: No data found  Lacerations: No data found  Repair suture: No data found  Repair # of packets: No data found  Blood loss (ml): 150   Information for the patient's newborn:  [name], [name] [[id number]]   Delivery 02/06/2022 10:53 PM by  Vaginal, Spontaneous Sex:  female Gestational Age: [redacted]w[redacted]d Delivery Clinician:   Living?:         APGARS  One minute Five minutes Ten minutes  Skin color:        Heart rate:        Grimace:        Muscle tone:        Breathing:        Totals: 8  9      Presentation/position:      Resuscitation:   Cord information:    Disposition of cord blood:     Blood gases sent?  Complications:   Placenta: Delivered:       appearance Newborn Measurements: Weight: 7 lb 9.7 oz (3450 g)  Height: 20"  Head circumference:    Chest circumference:    Other providers:    Additional  information: Forceps:   Vacuum:   Breech:   Observed anomalies       Delivery Note At 10:53 PM a viable and healthy female was delivered via Vaginal, Spontaneous (Presentation:  Vertex; LOA position).  APGAR: 8, 9; weight 3450 grams.   Placenta status: Spontaneous, Intact.  Cord: 3 vessels with the following complications: None.  Cord pH: not obtained. Delayed cord clamping observed. Cord blood collected.   Anesthesia: Epidural Episiotomy: None Lacerations: Vaginal (bilateral, R>L) Suture Repair: 3.0 vicryl, 2 figure-of-eight sutures for right vaginal tear. Left tear hemostatic.  Est. Blood Loss (mL): 150  Mom to postpartum.  Baby to Couplet care / Skin to Skin.  Hildred Laser, MD 02/06/2022, 11:40 PM

## 2021-08-15 ENCOUNTER — Other Ambulatory Visit: Payer: Self-pay

## 2021-08-15 ENCOUNTER — Encounter: Payer: Self-pay | Admitting: Obstetrics and Gynecology

## 2021-08-15 ENCOUNTER — Ambulatory Visit (INDEPENDENT_AMBULATORY_CARE_PROVIDER_SITE_OTHER): Payer: BC Managed Care – PPO | Admitting: Obstetrics and Gynecology

## 2021-08-15 VITALS — BP 112/80 | HR 97 | Resp 16 | Ht 66.0 in | Wt 152.5 lb

## 2021-08-15 DIAGNOSIS — Z3A15 15 weeks gestation of pregnancy: Secondary | ICD-10-CM | POA: Diagnosis not present

## 2021-08-15 DIAGNOSIS — Z7689 Persons encountering health services in other specified circumstances: Secondary | ICD-10-CM

## 2021-08-15 DIAGNOSIS — Z1379 Encounter for other screening for genetic and chromosomal anomalies: Secondary | ICD-10-CM

## 2021-08-15 NOTE — Progress Notes (Signed)
HPI:      [name] is a 26 y.o. G3P0020 who LMP was Patient's last menstrual period was 04/26/2021 (exact date).  Subjective:   She presents today to establish care at [facility].  She has not been receiving prenatal care.  By last menstrual period she is approximately 15 weeks estimated gestational age.  She is taking prenatal vitamins.  She denies problems.    Hx: The following portions of the patient's history were reviewed and updated as appropriate:             She  has a past medical history of Asthma and Snapping hip syndrome. She does not have any pertinent problems on file. She  has a past surgical history that includes Knee surgery. Her family history includes Healthy in her father and mother. She  reports that she has never smoked. She has never used smokeless tobacco. She reports that she does not currently use drugs after having used the following drugs: Marijuana. She reports that she does not drink alcohol. She has a current medication list which includes the following prescription(s): montelukast, albuterol, fluticasone-salmeterol, multivitamin-prenatal, aerochamber plus, and UNABLE TO FIND. She has No Known Allergies.       Review of Systems:  Review of Systems  Constitutional: Denied constitutional symptoms, night sweats, recent illness, fatigue, fever, insomnia and weight loss.  Eyes: Denied eye symptoms, eye pain, photophobia, vision change and visual disturbance.  Ears/Nose/Throat/Neck: Denied ear, nose, throat or neck symptoms, hearing loss, nasal discharge, sinus congestion and sore throat.  Cardiovascular: Denied cardiovascular symptoms, arrhythmia, chest pain/pressure, edema, exercise intolerance, orthopnea and palpitations.  Respiratory: Denied pulmonary symptoms, asthma, pleuritic pain, productive sputum, cough, dyspnea and wheezing.  Gastrointestinal: Denied, gastro-esophageal reflux, melena, nausea and vomiting.  Genitourinary: Denied genitourinary symptoms  including symptomatic vaginal discharge, pelvic relaxation issues, and urinary complaints.  Musculoskeletal: Denied musculoskeletal symptoms, stiffness, swelling, muscle weakness and myalgia.  Dermatologic: Denied dermatology symptoms, rash and scar.  Neurologic: Denied neurology symptoms, dizziness, headache, neck pain and syncope.  Psychiatric: Denied psychiatric symptoms, anxiety and depression.  Endocrine: Denied endocrine symptoms including hot flashes and night sweats.   Meds:   Current Outpatient Medications on File Prior to Visit  Medication Sig Dispense Refill   montelukast (SINGULAIR) 10 MG tablet Take 10 mg by mouth daily.     albuterol (PROAIR HFA) 108 (90 Base) MCG/ACT inhaler Inhale 2 puffs into the lungs every 6 (six) hours as needed for wheezing.     Fluticasone-Salmeterol (ADVAIR) 250-50 MCG/DOSE AEPB Inhale into the lungs 2 (two) times daily.     Prenatal Vit-Fe Fumarate-FA (MULTIVITAMIN-PRENATAL) 27-0.8 MG TABS tablet Take 1 tablet by mouth daily at 12 noon. 100 tablet 0   Spacer/Aero-Holding Chambers (AEROCHAMBER PLUS) inhaler Use as instructed 1 each 2   UNABLE TO FIND Outpatient physical therapy Dx: ataxia Evaluate and treat 1 each 0   No current facility-administered medications on file prior to visit.      Objective:     Vitals:   08/15/21 0907  BP: 112/80  Pulse: 97  Resp: 16   Filed Weights   08/15/21 0907  Weight: 152 lb 8 oz (69.2 kg)              Positive fetal heart tones - 152  Uterus 14 weeks size by abdominal palpation.          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 Encounter to establish care   2. [redacted] weeks gestation of pregnancy        Plan:            Prenatal Plan 1.  The patient was given prenatal literature. 2.  She was  continued on prenatal vitamins. 3.  A prenatal lab panel to be drawn at nurse visit. 4.  An ultrasound was ordered to better determine an EDC. 5.  A nurse visit was scheduled. 6.  Genetic testing and testing for other inheritable conditions discussed in detail. She would like to have this performed today.   7.  A general overview of pregnancy testing, visit schedule, ultrasound schedule, and prenatal care was discussed. 8.  COVID and its risks associated with pregnancy, prevention by limiting exposure and use of masks, as well as the risks and benefits of vaccination during pregnancy were discussed in detail.  [facility] policy regarding office and hospital visitation and testing was explained. 9.  Benefits of breast-feeding discussed in detail including both maternal and infant benefits. Ready Set Baby website discussed. 10.  aFP today 11.  Prenatal labs  Orders Orders Placed This Encounter  Procedures   US OB DETAIL + 14 WK    No orders of the defined types were placed in this encounter.     F/U  No follow-ups on file. I spent 31 minutes involved in the care of this patient preparing to see the patient by obtaining and reviewing her medical history (including labs, imaging tests and prior procedures), documenting clinical information in the electronic health record (EHR), counseling and coordinating care plans, writing and sending prescriptions, ordering tests or procedures and in direct communicating with the patient and medical staff discussing pertinent items from her history and physical exam.  Finis Bud, M.D. 08/15/2021 9:36 AM

## 2021-08-17 LAB — AFP, SERUM, OPEN SPINA BIFIDA
AFP MoM: 1.26
AFP Value: 43.3 ng/mL
Gest. Age on Collection Date: 15.9 weeks
Maternal Age At EDD: 24.3 yr
OSBR Risk 1 IN: 5411
Test Results:: NEGATIVE
Weight: 152 [lb_av]

## 2021-08-20 LAB — MATERNIT21  PLUS CORE+ESS+SCA, BLOOD
11q23 deletion (Jacobsen): NOT DETECTED
15q11 deletion (PW Angelman): NOT DETECTED
1p36 deletion syndrome: NOT DETECTED
22q11 deletion (DiGeorge): NOT DETECTED
4p16 deletion(Wolf-Hirschhorn): NOT DETECTED
5p15 deletion (Cri-du-chat): NOT DETECTED
8q24 deletion (Langer-Giedion): NOT DETECTED
Fetal Fraction: 11
Monosomy X (Turner Syndrome): NOT DETECTED
Result (T21): NEGATIVE
Trisomy 13 (Patau syndrome): NEGATIVE
Trisomy 16: NOT DETECTED
Trisomy 18 (Edwards syndrome): NEGATIVE
Trisomy 21 (Down syndrome): NEGATIVE
Trisomy 22: NOT DETECTED
XXX (Triple X Syndrome): NOT DETECTED
XXY (Klinefelter Syndrome): NOT DETECTED
XYY (Jacobs Syndrome): NOT DETECTED

## 2021-08-22 ENCOUNTER — Other Ambulatory Visit: Payer: Self-pay

## 2021-08-22 ENCOUNTER — Ambulatory Visit (INDEPENDENT_AMBULATORY_CARE_PROVIDER_SITE_OTHER): Payer: BC Managed Care – PPO

## 2021-08-22 ENCOUNTER — Other Ambulatory Visit: Payer: Self-pay | Admitting: Obstetrics and Gynecology

## 2021-08-22 DIAGNOSIS — Z3A15 15 weeks gestation of pregnancy: Secondary | ICD-10-CM

## 2021-08-22 DIAGNOSIS — Z7689 Persons encountering health services in other specified circumstances: Secondary | ICD-10-CM

## 2021-08-31 ENCOUNTER — Ambulatory Visit (INDEPENDENT_AMBULATORY_CARE_PROVIDER_SITE_OTHER): Payer: BC Managed Care – PPO | Admitting: Obstetrics and Gynecology

## 2021-08-31 ENCOUNTER — Other Ambulatory Visit: Payer: Self-pay

## 2021-08-31 VITALS — BP 113/80 | HR 92 | Ht 66.0 in | Wt 153.3 lb

## 2021-08-31 DIAGNOSIS — Z3A18 18 weeks gestation of pregnancy: Secondary | ICD-10-CM

## 2021-08-31 DIAGNOSIS — Z3402 Encounter for supervision of normal first pregnancy, second trimester: Secondary | ICD-10-CM

## 2021-08-31 DIAGNOSIS — Z0283 Encounter for blood-alcohol and blood-drug test: Secondary | ICD-10-CM

## 2021-08-31 DIAGNOSIS — Z113 Encounter for screening for infections with a predominantly sexual mode of transmission: Secondary | ICD-10-CM | POA: Diagnosis not present

## 2021-08-31 NOTE — Addendum Note (Signed)
Addended by: Loman Chroman on: 08/31/2021 02:03 PM   Modules accepted: Orders

## 2021-08-31 NOTE — Progress Notes (Signed)
Anna Fox presents for NOB nurse intake visit. Pregnancy confirmation done at [facility] on 08/15/21 by Dr. Brennan Bailey.  G4  P0030.  LMP 04/26/21.  EDD 01/31/22.  [place] [redacted]w[redacted]d. Pregnancy education material explained and given.  0 cats in the home.  NOB labs ordered. BMI not greater than 30. TSH/HbgA1c not ordered. Sickle cell not ordered due to race. HIV and drug screen explained and ordered. Genetic screening discussed. Genetic testing previously completed.  PNV encouraged. Pt to follow up with provider in 1 week for NOB physical.  FMLA,[facility] Financial Policy and HIV/Drug all reviewed and signed by patient.

## 2021-09-01 LAB — URINALYSIS, ROUTINE W REFLEX MICROSCOPIC
Bilirubin, UA: NEGATIVE
Glucose, UA: NEGATIVE
Ketones, UA: NEGATIVE
Leukocytes,UA: NEGATIVE
Nitrite, UA: NEGATIVE
Protein,UA: NEGATIVE
RBC, UA: NEGATIVE
Specific Gravity, UA: 1.024 (ref 1.005–1.030)
Urobilinogen, Ur: 0.2 mg/dL (ref 0.2–1.0)
pH, UA: 6 (ref 5.0–7.5)

## 2021-09-02 LAB — URINE CULTURE, OB REFLEX

## 2021-09-02 LAB — CULTURE, OB URINE

## 2021-09-03 LAB — PAIN MGT SCRN (14 DRUGS), UR
Amphetamine Scrn, Ur: NEGATIVE ng/mL
BARBITURATE SCREEN URINE: NEGATIVE ng/mL
BENZODIAZEPINE SCREEN, URINE: NEGATIVE ng/mL
Buprenorphine, Urine: NEGATIVE ng/mL
CANNABINOIDS UR QL SCN: POSITIVE ng/mL — AB
Cocaine (Metab) Scrn, Ur: NEGATIVE ng/mL
Creatinine(Crt), U: 114.4 mg/dL (ref 20.0–300.0)
Fentanyl, Urine: NEGATIVE pg/mL
Meperidine Screen, Urine: NEGATIVE ng/mL
Methadone Screen, Urine: NEGATIVE ng/mL
OXYCODONE+OXYMORPHONE UR QL SCN: NEGATIVE ng/mL
Opiate Scrn, Ur: NEGATIVE ng/mL
Ph of Urine: 6 (ref 4.5–8.9)
Phencyclidine Qn, Ur: NEGATIVE ng/mL
Propoxyphene Scrn, Ur: NEGATIVE ng/mL
Tramadol Screen, Urine: NEGATIVE ng/mL

## 2021-09-03 LAB — NICOTINE SCREEN, URINE: Cotinine Ql Scrn, Ur: NEGATIVE ng/mL

## 2021-09-04 LAB — ANTIBODY SCREEN: Antibody Screen: NEGATIVE

## 2021-09-04 LAB — VIRAL HEPATITIS HBV, HCV
HCV Ab: <0.1 s/co ratio (ref 0.0–0.9)
Hep B Core Total Ab: NEGATIVE
Hep B Surface Ab, Qual: NONREACTIVE
Hepatitis B Surface Ag: NEGATIVE

## 2021-09-04 LAB — PARVOVIRUS B19 ANTIBODY, IGG AND IGM
Parvovirus B19 IgG: 6.9 index — ABNORMAL HIGH (ref 0.0–0.8)
Parvovirus B19 IgM: 0.2 index (ref 0.0–0.8)

## 2021-09-04 LAB — RUBELLA SCREEN: Rubella Antibodies, IGG: 2.27 index (ref >0.99)

## 2021-09-04 LAB — ABO AND RH: Rh Factor: POSITIVE

## 2021-09-04 LAB — GC/CHLAMYDIA PROBE AMP
Chlamydia trachomatis, NAA: NEGATIVE
Neisseria Gonorrhoeae by PCR: NEGATIVE

## 2021-09-04 LAB — VARICELLA ZOSTER ANTIBODY, IGG: Varicella zoster IgG: <135 index — ABNORMAL LOW (ref >165)

## 2021-09-04 LAB — RPR: RPR Ser Ql: NONREACTIVE

## 2021-09-04 LAB — HCV INTERPRETATION

## 2021-09-04 LAB — HIV ANTIBODY (ROUTINE TESTING W REFLEX): HIV Screen 4th Generation wRfx: NONREACTIVE

## 2021-09-05 ENCOUNTER — Other Ambulatory Visit: Payer: Self-pay

## 2021-09-05 ENCOUNTER — Ambulatory Visit (INDEPENDENT_AMBULATORY_CARE_PROVIDER_SITE_OTHER): Payer: BC Managed Care – PPO | Admitting: Obstetrics and Gynecology

## 2021-09-05 ENCOUNTER — Encounter: Payer: Self-pay | Admitting: Obstetrics and Gynecology

## 2021-09-05 VITALS — BP 122/79 | HR 83 | Wt 151.2 lb

## 2021-09-05 DIAGNOSIS — Z1379 Encounter for other screening for genetic and chromosomal anomalies: Secondary | ICD-10-CM | POA: Diagnosis not present

## 2021-09-05 DIAGNOSIS — Z3A18 18 weeks gestation of pregnancy: Secondary | ICD-10-CM | POA: Diagnosis not present

## 2021-09-05 DIAGNOSIS — Z23 Encounter for immunization: Secondary | ICD-10-CM

## 2021-09-05 DIAGNOSIS — Z3402 Encounter for supervision of normal first pregnancy, second trimester: Secondary | ICD-10-CM | POA: Diagnosis not present

## 2021-09-05 LAB — POCT URINALYSIS DIPSTICK OB
Bilirubin, UA: NEGATIVE
Blood, UA: NEGATIVE
Glucose, UA: NEGATIVE
Ketones, UA: NEGATIVE
Leukocytes, UA: NEGATIVE
Nitrite, UA: NEGATIVE
POC,PROTEIN,UA: NEGATIVE
Spec Grav, UA: 1.015 (ref 1.010–1.025)
Urobilinogen, UA: 0.2 E.U./dL
pH, UA: 6.5 (ref 5.0–8.0)

## 2021-09-05 NOTE — Progress Notes (Signed)
NOB: Dates changed as patient further along than expected.  Patient desires AFP today.  Anatomy ultrasound scheduled with next visit.  Physical examination General NAD, Conversant  HEENT Atraumatic; Op clear with mmm.  Normo-cephalic. Pupils reactive. Anicteric sclerae  Thyroid/Neck Smooth without nodularity or enlargement. Normal ROM.  Neck Supple.  Skin No rashes, lesions or ulceration. Normal palpated skin turgor. No nodularity.  Breasts: No masses or discharge.  Symmetric.  No axillary adenopathy.  Lungs: Clear to auscultation.No rales or wheezes. Normal Respiratory effort, no retractions.  Heart: NSR.  No murmurs or rubs appreciated. No periferal edema  Abdomen: Soft.  Non-tender.  No masses.  No HSM. No hernia  Extremities: Moves all appropriately.  Normal ROM for age. No lymphadenopathy.  Neuro: Oriented to PPT.  Normal mood. Normal affect.     Pelvic:   Vulva: Normal appearance.  No lesions.  Vagina: No lesions or abnormalities noted.  Support: Normal pelvic support.  Urethra No masses tenderness or scarring.  Meatus Normal size without lesions or prolapse.  Cervix: Normal appearance.  No lesions.  Anus: Normal exam.  No lesions.  Perineum: Normal exam.  No lesions.        Bimanual   Adnexae: No masses.  Non-tender to palpation.  Uterus: Enlarged.  17 weeks, 158 bpm.  Non-tender.  Mobile.  AV.  Adnexae: No masses.  Non-tender to palpation.  Cul-de-sac: Negative for abnormality.  Adnexae: No masses.  Non-tender to palpation.         Pelvimetry   Diagonal: Reached.  Spines: Average.  Sacrum: Concave.  Pubic Arch: Normal.

## 2021-09-05 NOTE — Progress Notes (Signed)
Patient presents for NOB physical. Patient states experiencing L sided sharp back pains, states she might get a belly band for comfort. Patient received Flu vaccine today. Patient completing AFP today. Patient states no other questions or concerns.

## 2021-09-12 LAB — AFP, SERUM, OPEN SPINA BIFIDA
AFP Value: 44.7 ng/mL
Gest. Age on Collection Date: 18.9 weeks
Maternal Age At EDD: 24.3 yr

## 2021-09-26 ENCOUNTER — Other Ambulatory Visit: Payer: Self-pay

## 2021-09-26 ENCOUNTER — Ambulatory Visit (INDEPENDENT_AMBULATORY_CARE_PROVIDER_SITE_OTHER): Payer: BC Managed Care – PPO

## 2021-09-26 DIAGNOSIS — Z3A22 22 weeks gestation of pregnancy: Secondary | ICD-10-CM

## 2021-09-26 DIAGNOSIS — Z3402 Encounter for supervision of normal first pregnancy, second trimester: Secondary | ICD-10-CM

## 2021-09-26 DIAGNOSIS — Z3A18 18 weeks gestation of pregnancy: Secondary | ICD-10-CM

## 2021-10-03 ENCOUNTER — Encounter: Payer: Medicaid Other | Admitting: Obstetrics and Gynecology

## 2021-10-10 ENCOUNTER — Other Ambulatory Visit: Payer: BC Managed Care – PPO

## 2021-10-16 ENCOUNTER — Other Ambulatory Visit: Payer: Self-pay | Admitting: Obstetrics and Gynecology

## 2021-10-16 ENCOUNTER — Other Ambulatory Visit: Payer: Self-pay

## 2021-10-16 ENCOUNTER — Ambulatory Visit (INDEPENDENT_AMBULATORY_CARE_PROVIDER_SITE_OTHER): Payer: BC Managed Care – PPO

## 2021-10-16 DIAGNOSIS — Z3A24 24 weeks gestation of pregnancy: Secondary | ICD-10-CM

## 2021-10-16 DIAGNOSIS — Z362 Encounter for other antenatal screening follow-up: Secondary | ICD-10-CM

## 2021-10-30 ENCOUNTER — Ambulatory Visit (INDEPENDENT_AMBULATORY_CARE_PROVIDER_SITE_OTHER): Payer: BC Managed Care – PPO | Admitting: Obstetrics and Gynecology

## 2021-10-30 ENCOUNTER — Encounter: Payer: Self-pay | Admitting: Obstetrics and Gynecology

## 2021-10-30 VITALS — BP 124/74 | HR 69 | Wt 165.6 lb

## 2021-10-30 DIAGNOSIS — Z3402 Encounter for supervision of normal first pregnancy, second trimester: Secondary | ICD-10-CM

## 2021-10-30 DIAGNOSIS — Z3A26 26 weeks gestation of pregnancy: Secondary | ICD-10-CM

## 2021-10-30 LAB — POCT URINALYSIS DIPSTICK OB
Bilirubin, UA: NEGATIVE
Blood, UA: NEGATIVE
Glucose, UA: NEGATIVE
Ketones, UA: NEGATIVE
Leukocytes, UA: NEGATIVE
Nitrite, UA: NEGATIVE
POC,PROTEIN,UA: NEGATIVE
Spec Grav, UA: 1.01 (ref 1.010–1.025)
Urobilinogen, UA: 0.2 E.U./dL
pH, UA: 8 (ref 5.0–8.0)

## 2021-10-30 NOTE — Progress Notes (Signed)
ROB: She has no concerns.  ?

## 2021-10-30 NOTE — Progress Notes (Signed)
ROB: Patient doing well. No major complaints.  Plans to breastfeed, desires unsure method for contraception. For 28 week labs next visit. Discussed breastfeeding, see topics. Discussed weight gain in pregnancy. RTC2 weeks, for 28 week labs at that time.  ? ? ?

## 2021-11-14 ENCOUNTER — Ambulatory Visit (INDEPENDENT_AMBULATORY_CARE_PROVIDER_SITE_OTHER): Payer: BC Managed Care – PPO | Admitting: Obstetrics and Gynecology

## 2021-11-14 ENCOUNTER — Encounter: Payer: Self-pay | Admitting: Obstetrics and Gynecology

## 2021-11-14 ENCOUNTER — Other Ambulatory Visit: Payer: BC Managed Care – PPO

## 2021-11-14 VITALS — BP 119/81 | HR 76 | Wt 164.4 lb

## 2021-11-14 DIAGNOSIS — Z131 Encounter for screening for diabetes mellitus: Secondary | ICD-10-CM

## 2021-11-14 DIAGNOSIS — Z23 Encounter for immunization: Secondary | ICD-10-CM | POA: Diagnosis not present

## 2021-11-14 DIAGNOSIS — Z3A28 28 weeks gestation of pregnancy: Secondary | ICD-10-CM

## 2021-11-14 DIAGNOSIS — Z3403 Encounter for supervision of normal first pregnancy, third trimester: Secondary | ICD-10-CM

## 2021-11-14 DIAGNOSIS — Z113 Encounter for screening for infections with a predominantly sexual mode of transmission: Secondary | ICD-10-CM

## 2021-11-14 DIAGNOSIS — F1221 Cannabis dependence, in remission: Secondary | ICD-10-CM

## 2021-11-14 LAB — POCT URINALYSIS DIPSTICK
Bilirubin, UA: NEGATIVE
Blood, UA: NEGATIVE
Glucose, UA: NEGATIVE
Ketones, UA: NEGATIVE
Leukocytes, UA: NEGATIVE
Nitrite, UA: NEGATIVE
Protein, UA: NEGATIVE
Spec Grav, UA: 1.01 (ref 1.010–1.025)
Urobilinogen, UA: 0.2 E.U./dL
pH, UA: 7 (ref 5.0–8.0)

## 2021-11-14 NOTE — Progress Notes (Signed)
ROB: Denies problems.  Reports daily movement.  Taking vitamins as directed.  GCT today. ?

## 2021-11-14 NOTE — Progress Notes (Signed)
ROB. She states fetal movement with no pain and pressure. Patient completed GCT, received TDAP injection and signed BTC today. Patient states no questions or concerns at this time.   ?

## 2021-11-14 NOTE — Addendum Note (Signed)
Addended by: Georgiana Shore R on: 11/14/2021 10:18 AM ? ? Modules accepted: Orders ? ?

## 2021-11-14 NOTE — Addendum Note (Signed)
Addended by: Georgiana Shore R on: 11/14/2021 10:21 AM ? ? Modules accepted: Orders ? ?

## 2021-11-15 LAB — CBC WITH DIFFERENTIAL/PLATELET
Basophils Absolute: 0 10*3/uL (ref 0.0–0.2)
Basos: 0 %
EOS (ABSOLUTE): 0.5 10*3/uL — ABNORMAL HIGH (ref 0.0–0.4)
Eos: 4 %
Hematocrit: 34.6 % (ref 34.0–46.6)
Hemoglobin: 11.6 g/dL (ref 11.1–15.9)
Immature Grans (Abs): 0.1 10*3/uL (ref 0.0–0.1)
Immature Granulocytes: 1 %
Lymphocytes Absolute: 1.9 10*3/uL (ref 0.7–3.1)
Lymphs: 16 %
MCH: 29.7 pg (ref 26.6–33.0)
MCHC: 33.5 g/dL (ref 31.5–35.7)
MCV: 89 fL (ref 79–97)
Monocytes Absolute: 0.4 10*3/uL (ref 0.1–0.9)
Monocytes: 4 %
Neutrophils Absolute: 9 10*3/uL — ABNORMAL HIGH (ref 1.4–7.0)
Neutrophils: 75 %
Platelets: 206 10*3/uL (ref 150–450)
RBC: 3.9 x10E6/uL (ref 3.77–5.28)
RDW: 12.6 % (ref 11.7–15.4)
WBC: 12 10*3/uL — ABNORMAL HIGH (ref 3.4–10.8)

## 2021-11-15 LAB — GLUCOSE TOLERANCE, 1 HOUR: Glucose, 1Hr PP: 96 mg/dL (ref 70–199)

## 2021-11-15 LAB — RPR: RPR Ser Ql: NONREACTIVE

## 2021-11-16 LAB — PAIN MGT SCRN (14 DRUGS), UR
Amphetamine Scrn, Ur: NEGATIVE ng/mL
BARBITURATE SCREEN URINE: NEGATIVE ng/mL
BENZODIAZEPINE SCREEN, URINE: NEGATIVE ng/mL
Buprenorphine, Urine: NEGATIVE ng/mL
CANNABINOIDS UR QL SCN: POSITIVE ng/mL — AB
Cocaine (Metab) Scrn, Ur: NEGATIVE ng/mL
Creatinine(Crt), U: 109.5 mg/dL (ref 20.0–300.0)
Fentanyl, Urine: NEGATIVE pg/mL
Meperidine Screen, Urine: NEGATIVE ng/mL
Methadone Screen, Urine: NEGATIVE ng/mL
OXYCODONE+OXYMORPHONE UR QL SCN: NEGATIVE ng/mL
Opiate Scrn, Ur: NEGATIVE ng/mL
Ph of Urine: 8.2 (ref 4.5–8.9)
Phencyclidine Qn, Ur: NEGATIVE ng/mL
Propoxyphene Scrn, Ur: NEGATIVE ng/mL
Tramadol Screen, Urine: NEGATIVE ng/mL

## 2021-12-07 ENCOUNTER — Ambulatory Visit (INDEPENDENT_AMBULATORY_CARE_PROVIDER_SITE_OTHER): Payer: BC Managed Care – PPO | Admitting: Obstetrics and Gynecology

## 2021-12-07 VITALS — BP 118/70 | HR 89 | Wt 169.7 lb

## 2021-12-07 DIAGNOSIS — Z3403 Encounter for supervision of normal first pregnancy, third trimester: Secondary | ICD-10-CM

## 2021-12-07 DIAGNOSIS — Z3A32 32 weeks gestation of pregnancy: Secondary | ICD-10-CM

## 2021-12-07 LAB — POCT URINALYSIS DIPSTICK OB
Bilirubin, UA: NEGATIVE
Blood, UA: NEGATIVE
Glucose, UA: NEGATIVE
Ketones, UA: NEGATIVE
Nitrite, UA: NEGATIVE
Spec Grav, UA: 1.01 (ref 1.010–1.025)
Urobilinogen, UA: 0.2 E.U./dL
pH, UA: 8 (ref 5.0–8.0)

## 2021-12-07 NOTE — Progress Notes (Signed)
ROB: Doing well, no issues.  Discussed Pediatrician list. Advised on third trimester expectations. RTC in 2 weeks.

## 2021-12-07 NOTE — Progress Notes (Signed)
ROB: Patient is doing well, no new concerns today. Unable to leave urine sample at intake.

## 2021-12-07 NOTE — Patient Instructions (Signed)

## 2021-12-20 ENCOUNTER — Encounter: Payer: Self-pay | Admitting: Obstetrics and Gynecology

## 2021-12-20 ENCOUNTER — Ambulatory Visit (INDEPENDENT_AMBULATORY_CARE_PROVIDER_SITE_OTHER): Payer: BC Managed Care – PPO | Admitting: Obstetrics and Gynecology

## 2021-12-20 VITALS — BP 116/83 | HR 92 | Wt 169.7 lb

## 2021-12-20 DIAGNOSIS — Z3403 Encounter for supervision of normal first pregnancy, third trimester: Secondary | ICD-10-CM

## 2021-12-20 DIAGNOSIS — Z3A34 34 weeks gestation of pregnancy: Secondary | ICD-10-CM

## 2021-12-20 LAB — POCT URINALYSIS DIPSTICK OB
Bilirubin, UA: NEGATIVE
Blood, UA: NEGATIVE
Glucose, UA: NEGATIVE
Ketones, UA: NEGATIVE
Leukocytes, UA: NEGATIVE
Nitrite, UA: NEGATIVE
Spec Grav, UA: 1.01 (ref 1.010–1.025)
Urobilinogen, UA: 0.2 E.U./dL
pH, UA: 8 (ref 5.0–8.0)

## 2021-12-20 NOTE — Progress Notes (Signed)
ROB: She is doing well, no new concerns today. ?

## 2021-12-20 NOTE — Progress Notes (Signed)
ROB. She states fetal movement with no pain or pressure at this time. Patient states occasional morning sickness but states it is dependent on what she eats.  Patient states no questions or concerns at this time.

## 2021-12-20 NOTE — Progress Notes (Signed)
ROB: No complaints-daily movement.  Cultures next visit.

## 2021-12-25 ENCOUNTER — Telehealth: Payer: Self-pay | Admitting: Obstetrics and Gynecology

## 2021-12-25 NOTE — Telephone Encounter (Signed)
Pt called stating that she has not been able to keep anything down since Sunday, she denies fever or headache. Pt states she had diahrea yesterday. Pt currently 34 weeks and concerned about dehydration.Please advise.

## 2022-01-03 ENCOUNTER — Ambulatory Visit (INDEPENDENT_AMBULATORY_CARE_PROVIDER_SITE_OTHER): Payer: BC Managed Care – PPO | Admitting: Obstetrics and Gynecology

## 2022-01-03 ENCOUNTER — Encounter: Payer: Self-pay | Admitting: Obstetrics and Gynecology

## 2022-01-03 VITALS — BP 127/82 | HR 105 | Wt 165.8 lb

## 2022-01-03 DIAGNOSIS — Z3480 Encounter for supervision of other normal pregnancy, unspecified trimester: Secondary | ICD-10-CM

## 2022-01-03 DIAGNOSIS — Z3A36 36 weeks gestation of pregnancy: Secondary | ICD-10-CM

## 2022-01-03 DIAGNOSIS — Z3685 Encounter for antenatal screening for Streptococcus B: Secondary | ICD-10-CM

## 2022-01-03 LAB — POCT URINALYSIS DIPSTICK OB
Bilirubin, UA: NEGATIVE
Blood, UA: NEGATIVE
Glucose, UA: NEGATIVE
Ketones, UA: NEGATIVE
Leukocytes, UA: NEGATIVE
Nitrite, UA: NEGATIVE
POC,PROTEIN,UA: NEGATIVE
Spec Grav, UA: 1.01 (ref 1.010–1.025)
Urobilinogen, UA: 0.2 E.U./dL
pH, UA: 7.5 (ref 5.0–8.0)

## 2022-01-03 NOTE — Progress Notes (Signed)
ROB: Patient doing well, no issues. 36 week cultures done today. Reviewed labor precautions. RTC in 1 week.

## 2022-01-03 NOTE — Patient Instructions (Signed)
Signs and Symptoms of Labor Labor is the body's natural process of moving the baby and the placenta out of the uterus. The process of labor usually starts when the baby is full-term, between 39 and 41 weeks of pregnancy. Signs and symptoms that you are close to going into labor As your body prepares for labor and the birth of your baby, you may notice the following symptoms in the weeks and days before true labor starts: Passing a small amount of thick, bloody mucus from your vagina. This is called normal bloody show or losing your mucus plug. This may happen more than a week before labor begins, or right before labor begins, as the opening of the cervix starts to widen (dilate). For some women, the entire mucus plug passes at once. For others, pieces of the mucus plug may gradually pass over several days. Your baby moving (dropping) lower in your pelvis to get into position for birth (lightening). When this happens, you may feel more pressure on your bladder and pelvic bone and less pressure on your ribs. This may make it easier to breathe. It may also cause you to need to urinate more often and have problems with bowel movements. Having "practice contractions," also called Braxton Hicks contractions or false labor. These occur at irregular (unevenly spaced) intervals that are more than 10 minutes apart. False labor contractions are common after exercise or sexual activity. They will stop if you change position, rest, or drink fluids. These contractions are usually mild and do not get stronger over time. They may feel like: A backache or back pain. Mild cramps, similar to menstrual cramps. Tightening or pressure in your abdomen. Other early symptoms include: Nausea or loss of appetite. Diarrhea. Having a sudden burst of energy, or feeling very tired. Mood changes. Having trouble sleeping. Signs and symptoms that labor has begun Signs that you are in labor may include: Having contractions that come  at regular (evenly spaced) intervals and increase in intensity. This may feel like more intense tightening or pressure in your abdomen that moves to your back. Contractions may also feel like rhythmic pain in your upper thighs or back that comes and goes at regular intervals. If you are delivering for the first time, this change in intensity of contractions often occurs at a more gradual pace. If you have given birth before, you may notice a more rapid progression of contraction changes. Feeling pressure in the vaginal area. Your water breaking (rupture of membranes). This is when the sac of fluid that surrounds your baby breaks. Fluid leaking from your vagina may be clear or blood-tinged. Labor usually starts within 24 hours of your water breaking, but it may take longer to begin. Some people may feel a sudden gush of fluid; others may notice repeatedly damp underwear. Follow these instructions at home:  When labor starts, or if your water breaks, call your health care provider or nurse care line. Based on your situation, they will determine when you should go in for an exam. During early labor, you may be able to rest and manage symptoms at home. Some strategies to try at home include: Breathing and relaxation techniques. Taking a warm bath or shower. Listening to music. Using a heating pad on the lower back for pain. If directed, apply heat to the area as often as told by your health care provider. Use the heat source that your health care provider recommends, such as a moist heat pack or a heating pad. Place a   towel between your skin and the heat source. Leave the heat on for 20-30 minutes. Remove the heat if your skin turns bright red. This is especially important if you are unable to feel pain, heat, or cold. You have a greater risk of getting burned. Contact a health care provider if: Your labor has started. Your water breaks. You have nausea, vomiting, or diarrhea. Get help right away  if: You have painful, regular contractions that are 5 minutes apart or less. Labor starts before you are [redacted] weeks along in your pregnancy. You have a fever. You have bright red blood coming from your vagina. You do not feel your baby moving. You have a severe headache with or without vision problems. You have chest pain or shortness of breath. These symptoms may represent a serious problem that is an emergency. Do not wait to see if the symptoms will go away. Get medical help right away. Call your local emergency services (911 in the [place] Do not drive yourself to the hospital. Summary Labor is your body's natural process of moving your baby and the placenta out of your uterus. The process of labor usually starts when your baby is full-term, between 39 and 40 weeks of pregnancy. When labor starts, or if your water breaks, call your health care provider or nurse care line. Based on your situation, they will determine when you should go in for an exam. This information is not intended to replace advice given to you by your health care provider. Make sure you discuss any questions you have with your health care provider. Document Revised: 11/21/2020 Document Reviewed: 11/21/2020 Elsevier Patient Education  2023 Elsevier Inc.  

## 2022-01-03 NOTE — Progress Notes (Signed)
ROB.She states fetal movement with no pain or pressure at this time. Patient states small red spots on leg at times.Light yellow discharge at times.

## 2022-01-05 LAB — GC/CHLAMYDIA PROBE AMP
Chlamydia trachomatis, NAA: NEGATIVE
Neisseria Gonorrhoeae by PCR: NEGATIVE

## 2022-01-08 LAB — STREP GP B NAA: Strep Gp B NAA: POSITIVE — AB

## 2022-01-16 ENCOUNTER — Other Ambulatory Visit (INDEPENDENT_AMBULATORY_CARE_PROVIDER_SITE_OTHER): Payer: BC Managed Care – PPO

## 2022-01-16 ENCOUNTER — Ambulatory Visit (INDEPENDENT_AMBULATORY_CARE_PROVIDER_SITE_OTHER): Payer: BC Managed Care – PPO | Admitting: Obstetrics and Gynecology

## 2022-01-16 ENCOUNTER — Encounter: Payer: Self-pay | Admitting: Obstetrics and Gynecology

## 2022-01-16 VITALS — BP 122/72 | HR 72 | Wt 168.6 lb

## 2022-01-16 DIAGNOSIS — Z3A37 37 weeks gestation of pregnancy: Secondary | ICD-10-CM | POA: Diagnosis not present

## 2022-01-16 DIAGNOSIS — O26843 Uterine size-date discrepancy, third trimester: Secondary | ICD-10-CM | POA: Diagnosis not present

## 2022-01-16 DIAGNOSIS — Z362 Encounter for other antenatal screening follow-up: Secondary | ICD-10-CM | POA: Diagnosis not present

## 2022-01-16 DIAGNOSIS — Z3403 Encounter for supervision of normal first pregnancy, third trimester: Secondary | ICD-10-CM

## 2022-01-16 NOTE — Progress Notes (Signed)
ROB: Patient doing well, thinks she may be losing her mucus plug.  S<D noted today, will get growth scan

## 2022-01-16 NOTE — Progress Notes (Signed)
ROB: She is doing well, thinks she may be losing her mucous plug.

## 2022-01-23 ENCOUNTER — Encounter: Payer: Self-pay | Admitting: Obstetrics and Gynecology

## 2022-01-23 ENCOUNTER — Ambulatory Visit (INDEPENDENT_AMBULATORY_CARE_PROVIDER_SITE_OTHER): Payer: BC Managed Care – PPO | Admitting: Obstetrics and Gynecology

## 2022-01-23 VITALS — BP 132/85 | HR 73 | Wt 169.5 lb

## 2022-01-23 DIAGNOSIS — Z3403 Encounter for supervision of normal first pregnancy, third trimester: Secondary | ICD-10-CM

## 2022-01-23 DIAGNOSIS — Z3A38 38 weeks gestation of pregnancy: Secondary | ICD-10-CM

## 2022-01-23 LAB — POCT URINALYSIS DIPSTICK OB
Bilirubin, UA: NEGATIVE
Blood, UA: NEGATIVE
Glucose, UA: NEGATIVE
Ketones, UA: NEGATIVE
Leukocytes, UA: NEGATIVE
Nitrite, UA: NEGATIVE
POC,PROTEIN,UA: NEGATIVE
Spec Grav, UA: 1.01 (ref 1.010–1.025)
Urobilinogen, UA: 0.2 E.U./dL
pH, UA: 7.5 (ref 5.0–8.0)

## 2022-01-23 NOTE — Progress Notes (Signed)
ROB: Denies strong contractions.  Reports she is doing well.  Daily fetal movement.  GBS positive discussed.  Head low in the pelvis, anterior cervix greater than posterior.  Expect more rapid than normal labor for a primipara.  She is considering epidural.

## 2022-01-23 NOTE — Progress Notes (Signed)
ROB. Patient states fetal movement with no pressure at this time. She states occasional braxton hicks and losing some of her mucous plug. Patient states no questions or concerns at this time.

## 2022-01-30 ENCOUNTER — Encounter: Payer: Self-pay | Admitting: Obstetrics and Gynecology

## 2022-01-30 ENCOUNTER — Ambulatory Visit (INDEPENDENT_AMBULATORY_CARE_PROVIDER_SITE_OTHER): Payer: BC Managed Care – PPO | Admitting: Obstetrics and Gynecology

## 2022-01-30 VITALS — BP 122/80 | HR 76 | Wt 167.5 lb

## 2022-01-30 DIAGNOSIS — Z3403 Encounter for supervision of normal first pregnancy, third trimester: Secondary | ICD-10-CM

## 2022-01-30 DIAGNOSIS — Z3A39 39 weeks gestation of pregnancy: Secondary | ICD-10-CM

## 2022-01-30 DIAGNOSIS — Z3490 Encounter for supervision of normal pregnancy, unspecified, unspecified trimester: Secondary | ICD-10-CM

## 2022-01-30 LAB — POCT URINALYSIS DIPSTICK OB
Bilirubin, UA: NEGATIVE
Blood, UA: NEGATIVE
Glucose, UA: NEGATIVE
Ketones, UA: NEGATIVE
Nitrite, UA: NEGATIVE
POC,PROTEIN,UA: NEGATIVE
Spec Grav, UA: 1.01 (ref 1.010–1.025)
Urobilinogen, UA: 0.2 E.U./dL
pH, UA: 8 (ref 5.0–8.0)

## 2022-01-30 NOTE — Progress Notes (Signed)
ROB: She is doing well. She has no concerns today.

## 2022-01-30 NOTE — Progress Notes (Addendum)
ROB: Doing well, no complaints.  Discussed IOL for postdates if no spontaneous labor. Scheduled for 7/19 at midnight. RTC in 5 days for OB visit and NST if undelivered. Reviewed labor precautions.

## 2022-01-30 NOTE — Addendum Note (Signed)
Addended by: Fabian November on: 01/30/2022 09:16 AM   Modules accepted: Orders

## 2022-02-06 ENCOUNTER — Encounter: Payer: BC Managed Care – PPO | Admitting: Obstetrics and Gynecology

## 2022-02-06 ENCOUNTER — Other Ambulatory Visit: Payer: Self-pay

## 2022-02-06 ENCOUNTER — Inpatient Hospital Stay: Payer: BC Managed Care – PPO | Admitting: Anesthesiology

## 2022-02-06 ENCOUNTER — Encounter: Payer: Self-pay | Admitting: Obstetrics and Gynecology

## 2022-02-06 ENCOUNTER — Ambulatory Visit: Payer: BC Managed Care – PPO

## 2022-02-06 ENCOUNTER — Inpatient Hospital Stay
Admission: RE | Admit: 2022-02-06 | Discharge: 2022-02-08 | DRG: 806 | Disposition: A | Discharge status: Home / Self Care | Payer: BC Managed Care – PPO | Source: Ambulatory Visit | Attending: Certified Nurse Midwife | Admitting: Certified Nurse Midwife

## 2022-02-06 DIAGNOSIS — O480 Post-term pregnancy: Principal | ICD-10-CM | POA: Diagnosis present

## 2022-02-06 DIAGNOSIS — O99820 Streptococcus B carrier state complicating pregnancy: Secondary | ICD-10-CM

## 2022-02-06 DIAGNOSIS — J4540 Moderate persistent asthma, uncomplicated: Secondary | ICD-10-CM | POA: Diagnosis present

## 2022-02-06 DIAGNOSIS — O99824 Streptococcus B carrier state complicating childbirth: Secondary | ICD-10-CM | POA: Diagnosis present

## 2022-02-06 DIAGNOSIS — O9952 Diseases of the respiratory system complicating childbirth: Secondary | ICD-10-CM | POA: Diagnosis present

## 2022-02-06 DIAGNOSIS — F1211 Cannabis abuse, in remission: Secondary | ICD-10-CM | POA: Diagnosis present

## 2022-02-06 DIAGNOSIS — O99513 Diseases of the respiratory system complicating pregnancy, third trimester: Secondary | ICD-10-CM | POA: Diagnosis not present

## 2022-02-06 DIAGNOSIS — Z3490 Encounter for supervision of normal pregnancy, unspecified, unspecified trimester: Secondary | ICD-10-CM

## 2022-02-06 DIAGNOSIS — Z3A40 40 weeks gestation of pregnancy: Secondary | ICD-10-CM | POA: Diagnosis not present

## 2022-02-06 DIAGNOSIS — O99324 Drug use complicating childbirth: Secondary | ICD-10-CM | POA: Diagnosis present

## 2022-02-06 LAB — CBC
HCT: 34.6 % — ABNORMAL LOW (ref 36.0–46.0)
Hemoglobin: 11.5 g/dL — ABNORMAL LOW (ref 12.0–15.0)
MCH: 28.4 pg (ref 26.0–34.0)
MCHC: 33.2 g/dL (ref 30.0–36.0)
MCV: 85.4 fL (ref 80.0–100.0)
Platelets: 269 10*3/uL (ref 150–400)
RBC: 4.05 MIL/uL (ref 3.87–5.11)
RDW: 13.2 % (ref 11.5–15.5)
WBC: 12.3 10*3/uL — ABNORMAL HIGH (ref 4.0–10.5)
nRBC: 0 % (ref 0.0–0.2)

## 2022-02-06 LAB — TYPE AND SCREEN
ABO/RH(D): O POS
Antibody Screen: NEGATIVE

## 2022-02-06 LAB — URINE DRUG SCREEN, QUALITATIVE (ARMC ONLY)
Amphetamines, Ur Screen: NOT DETECTED
Barbiturates, Ur Screen: NOT DETECTED
Benzodiazepine, Ur Scrn: NOT DETECTED
Cannabinoid 50 Ng, Ur ~~LOC~~: NOT DETECTED
Cocaine Metabolite,Ur ~~LOC~~: NOT DETECTED
MDMA (Ecstasy)Ur Screen: NOT DETECTED
Methadone Scn, Ur: NOT DETECTED
Opiate, Ur Screen: NOT DETECTED
Phencyclidine (PCP) Ur S: NOT DETECTED
Tricyclic, Ur Screen: NOT DETECTED

## 2022-02-06 LAB — ABO/RH: ABO/RH(D): O POS

## 2022-02-06 MED ORDER — TERBUTALINE SULFATE 1 MG/ML IJ SOLN: 0.2500 mg | Freq: Once | INTRAMUSCULAR | Status: DC | PRN

## 2022-02-06 MED ORDER — PHENYLEPHRINE 80 MCG/ML (10ML) SYRINGE FOR IV PUSH (FOR BLOOD PRESSURE SUPPORT): 80.0000 ug | PREFILLED_SYRINGE | INTRAVENOUS | Status: DC | PRN

## 2022-02-06 MED ORDER — LIDOCAINE HCL (PF) 1 % IJ SOLN
INTRAMUSCULAR | Status: DC | PRN
  Administered 2022-02-06: 2 mL via SUBCUTANEOUS

## 2022-02-06 MED ORDER — ACETAMINOPHEN 325 MG PO TABS: 650.0000 mg | ORAL_TABLET | ORAL | Status: DC | PRN

## 2022-02-06 MED ORDER — LACTATED RINGERS IV SOLN
500.0000 mL | INTRAVENOUS | Status: DC | PRN
  Administered 2022-02-06: 1000 mL via INTRAVENOUS

## 2022-02-06 MED ORDER — EPHEDRINE 5 MG/ML INJ: 10.0000 mg | INTRAVENOUS | Status: DC | PRN

## 2022-02-06 MED ORDER — SODIUM CHLORIDE 0.9 % IV SOLN
INTRAVENOUS | Status: DC | PRN
  Administered 2022-02-06 (×2): 5 mL via EPIDURAL

## 2022-02-06 MED ORDER — FENTANYL-BUPIVACAINE-NACL 0.5-0.125-0.9 MG/250ML-% EP SOLN
12.0000 mL/h | EPIDURAL | Status: DC | PRN
  Administered 2022-02-06: 12 mL/h via EPIDURAL

## 2022-02-06 MED ORDER — SODIUM CHLORIDE 0.9 % IV SOLN: 1.0000 g | INTRAVENOUS | Status: DC

## 2022-02-06 MED ORDER — MISOPROSTOL 50MCG HALF TABLET
ORAL_TABLET | ORAL | Status: AC
  Administered 2022-02-06: 50 ug via VAGINAL
  Filled 2022-02-06: qty 1

## 2022-02-06 MED ORDER — LIDOCAINE-EPINEPHRINE (PF) 1.5 %-1:200000 IJ SOLN
INTRAMUSCULAR | Status: DC | PRN
  Administered 2022-02-06: 3 mL via EPIDURAL

## 2022-02-06 MED ORDER — LIDOCAINE HCL (PF) 1 % IJ SOLN
30.0000 mL | INTRAMUSCULAR | Status: DC | PRN
  Filled 2022-02-06: qty 30

## 2022-02-06 MED ORDER — OXYCODONE-ACETAMINOPHEN 5-325 MG PO TABS: 2.0000 | ORAL_TABLET | ORAL | Status: DC | PRN

## 2022-02-06 MED ORDER — MISOPROSTOL 50MCG HALF TABLET
50.0000 ug | ORAL_TABLET | ORAL | Status: DC | PRN
  Filled 2022-02-06: qty 1

## 2022-02-06 MED ORDER — DIPHENHYDRAMINE HCL 50 MG/ML IJ SOLN
12.5000 mg | INTRAMUSCULAR | Status: DC | PRN
  Administered 2022-02-07: 12.5 mg via INTRAVENOUS
  Filled 2022-02-06: qty 1

## 2022-02-06 MED ORDER — OXYTOCIN 10 UNIT/ML IJ SOLN
INTRAMUSCULAR | Status: AC
  Filled 2022-02-06: qty 2

## 2022-02-06 MED ORDER — FENTANYL CITRATE (PF) 100 MCG/2ML IJ SOLN: 50.0000 ug | INTRAMUSCULAR | Status: DC | PRN

## 2022-02-06 MED ORDER — OXYTOCIN BOLUS FROM INFUSION
333.0000 mL | Freq: Once | INTRAVENOUS | Status: AC
  Administered 2022-02-06: 333 mL via INTRAVENOUS

## 2022-02-06 MED ORDER — MISOPROSTOL 200 MCG PO TABS
ORAL_TABLET | ORAL | Status: AC
  Filled 2022-02-06: qty 1

## 2022-02-06 MED ORDER — LACTATED RINGERS IV SOLN: INTRAVENOUS | Status: DC

## 2022-02-06 MED ORDER — AMMONIA AROMATIC IN INHA
RESPIRATORY_TRACT | Status: AC
  Filled 2022-02-06: qty 10

## 2022-02-06 MED ORDER — OXYCODONE-ACETAMINOPHEN 5-325 MG PO TABS: 1.0000 | ORAL_TABLET | ORAL | Status: DC | PRN

## 2022-02-06 MED ORDER — SOD CITRATE-CITRIC ACID 500-334 MG/5ML PO SOLN
30.0000 mL | ORAL | Status: DC | PRN
Start: 2022-02-06 — End: 2022-02-07

## 2022-02-06 MED ORDER — OXYTOCIN-SODIUM CHLORIDE 30-0.9 UT/500ML-% IV SOLN: 1.0000 m[IU]/min | INTRAVENOUS | Status: DC

## 2022-02-06 MED ORDER — FENTANYL-BUPIVACAINE-NACL 0.5-0.125-0.9 MG/250ML-% EP SOLN
EPIDURAL | Status: AC
  Filled 2022-02-06: qty 250

## 2022-02-06 MED ORDER — LACTATED RINGERS IV SOLN
500.0000 mL | Freq: Once | INTRAVENOUS | Status: AC
  Administered 2022-02-06: 500 mL via INTRAVENOUS

## 2022-02-06 MED ORDER — OXYTOCIN-SODIUM CHLORIDE 30-0.9 UT/500ML-% IV SOLN: 2.5000 [IU]/h | INTRAVENOUS | Status: DC

## 2022-02-06 MED ORDER — OXYTOCIN-SODIUM CHLORIDE 30-0.9 UT/500ML-% IV SOLN
1.0000 m[IU]/min | INTRAVENOUS | Status: DC
  Filled 2022-02-06: qty 500

## 2022-02-06 MED ORDER — SODIUM CHLORIDE 0.9 % IV SOLN
2.0000 g | Freq: Once | INTRAVENOUS | Status: AC
  Administered 2022-02-06: 2 g via INTRAVENOUS
  Filled 2022-02-06: qty 2000

## 2022-02-06 MED ORDER — ONDANSETRON HCL 4 MG/2ML IJ SOLN
4.0000 mg | Freq: Four times a day (QID) | INTRAMUSCULAR | Status: DC | PRN
  Administered 2022-02-06: 4 mg via INTRAVENOUS
  Filled 2022-02-06: qty 2

## 2022-02-06 NOTE — H&P (Signed)
Obstetric History and Physical  Anna Fox is a 26 y.o. G4P0030 with IUP at [redacted]w[redacted]d presenting for scheduled IOL. Patient states she has been having occasional mild contractions,  no  vaginal bleeding, intact membranes, with active fetal movement.    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   Prenatal labs and studies: ABO, Rh: --/--/O POS Performed at [facility], [address] [place], [place] [address]  [phone]) Antibody: NEG (07/19 1047) Rubella: 2.27 (02/10 1125) RPR: Non Reactive (04/26 0904)  HBsAg: Negative (02/10 1125)  HIV: Non Reactive (02/10 1125)  QQI:WLNLGXQJ/-- (06/15 0944) 1 hr Glucola  normal (96) Genetic screening normal Anatomy US normal   Past Medical History:  Diagnosis Date   Asthma    Snapping hip syndrome     Past Surgical History:  Procedure Laterality Date   KNEE SURGERY     X 2 Menisucs     OB History  Gravida Para Term Preterm AB Living  4 0 0   3    SAB IAB Ectopic Multiple Live Births  2 1          # Outcome Date GA Lbr Len/2nd Weight Sex Delivery Anes PTL Lv  4 Current           3 SAB 11/2020          2 SAB 09/2020          1 IAB 2015            Social History   Socioeconomic History   Marital status: Single    Spouse name: Not on file   Number of children: Not on file   Years of education: Not on file   Highest education level: Not on file   Occupational History   Occupation: Junior at [employer]  Tobacco Use   Smoking status: Never   Smokeless tobacco: Never  Vaping Use   Vaping Use: Never used  Substance and Sexual Activity   Alcohol use: No   Drug use: Yes    Types: Marijuana    Comment: last mj use - end of 05/2021   Sexual activity: Yes    Birth control/protection: Pill  Other Topics Concern   Not on file  Social History Narrative   Right handed    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   Past Month   fluticasone-salmeterol (ADVAIR) 250-50 MCG/ACT AEPB Inhale into the lungs.   02/05/2022   montelukast (SINGULAIR) 10 MG tablet Take 10 mg by mouth daily.   02/05/2022   Prenatal Vit-Fe Fumarate-FA (MULTIVITAMIN-PRENATAL) 27-0.8 MG TABS tablet Take 1 tablet by mouth daily at 12 noon.   Past Week   Spacer/Aero-Holding Chambers (AEROCHAMBER PLUS) inhaler Use as instructed 1 each 2    UNABLE TO FIND Outpatient physical therapy Dx: ataxia Evaluate and treat 1 each 0     No Known Allergies  Review of Systems: Negative except for what is mentioned in HPI.  Physical Exam: BP (!) 108/56   Pulse (!) 56   Temp 98 F (36.7 C) (Oral)   Resp 16   Ht 5\' 6"  (1.676 m)   Wt 76 kg   LMP 04/26/2021 (Exact Date)   BMI 27.04 kg/m  CONSTITUTIONAL: Well-developed, well-nourished female in no acute distress.  HENT:  Normocephalic,  atraumatic, External right and left ear normal. Oropharynx is clear and moist EYES: Conjunctivae and EOM are normal. Pupils are equal, round, and reactive to light. No scleral icterus.  NECK: Normal range of motion, supple, no masses SKIN: Skin is warm and dry. No rash noted. Not diaphoretic. No erythema. No pallor. NEUROLOGIC: Alert and oriented to person, place, and time. Normal reflexes, muscle tone coordination. No cranial nerve deficit noted. PSYCHIATRIC: Normal mood and affect. Normal behavior. Normal judgment and thought content. CARDIOVASCULAR: Normal heart rate noted, regular rhythm RESPIRATORY: Effort and breath sounds normal, no problems with respiration noted ABDOMEN: Soft, nontender, nondistended, gravid. MUSCULOSKELETAL: Normal range of motion. No edema and no tenderness. 2+ distal pulses.  Cervical Exam: Dilatation 1.5 cm   Effacement 50%   Station -3   Presentation: cephalic FHT:  Baseline rate 150 bpm   Variability moderate  Accelerations present   Decelerations none Contractions: Rare   Pertinent Labs/Studies:   Results for orders placed or performed during the hospital encounter of 02/06/22 (from the past 24 hour(s))  Type and screen     Status: None   Collection Time: 02/06/22 10:47 AM  Result Value Ref Range   ABO/RH(D) O POS    Antibody Screen NEG    Sample Expiration      02/09/2022,2359 Performed at [facility], [address], [place], [place]   Urine Drug Screen, Qualitative ([facility] only)     Status: None   Collection Time: 02/06/22 10:47 AM  Result Value Ref Range   Tricyclic, Ur Screen NONE DETECTED NONE DETECTED   Amphetamines, Ur Screen NONE DETECTED NONE DETECTED   MDMA (Ecstasy)Ur Screen NONE DETECTED NONE DETECTED   Cocaine Metabolite,Ur [place] NONE DETECTED NONE DETECTED   Opiate, Ur Screen NONE DETECTED NONE DETECTED   Phencyclidine (PCP) Ur S NONE DETECTED NONE DETECTED   Cannabinoid 50 Ng, Ur [place] NONE DETECTED NONE DETECTED    Barbiturates, Ur Screen NONE DETECTED NONE DETECTED   Benzodiazepine, Ur Scrn NONE DETECTED NONE DETECTED   Methadone Scn, Ur NONE DETECTED NONE DETECTED  CBC     Status: Abnormal   Collection Time: 02/06/22 11:00 AM  Result Value Ref Range   WBC 12.3 (H) 4.0 - 10.5 K/uL   RBC 4.05 3.87 - 5.11 MIL/uL   Hemoglobin 11.5 (L) 12.0 - 15.0 g/dL   HCT 02/08/22 (L) 40.3 - 47.4 %   MCV 85.4 80.0 - 100.0 fL  MCH 28.4 26.0 - 34.0 pg   MCHC 33.2 30.0 - 36.0 g/dL   RDW 13.2 11.5 - 15.5 %   Platelets 269 150 - 400 K/uL   nRBC 0.0 0.0 - 0.2 %  ABO/Rh     Status: None   Collection Time: 02/06/22 12:11 PM  Result Value Ref Range   ABO/RH(D)      O POS Performed at [facility], [address], [place], [place] [address]     Assessment : Anna Fox is a 26 y.o. G4P0030 at [redacted]w[redacted]d being admitted for induction of labor due to post-dates pregnancy.  H/o marijuana use in pregnancy, currently in remission. .  Plan: Labor: Induction/Augmentation as ordered as per protocol. First dose of Cytotec placed at 1:30 pm. Analgesia as needed. FWB: Reassuring fetal heart tracing.  GBS positive. To begin Ampicillin for prophylaxis.  Delivery plan: Hopeful for vaginal delivery   Rubie Maid, MD [facility]

## 2022-02-06 NOTE — Anesthesia Procedure Notes (Signed)
Epidural Patient location during procedure: OB Start time: 02/06/2022 7:53 PM End time: 02/06/2022 7:56 PM  Staffing Anesthesiologist: Lenard Simmer, MD Performed: anesthesiologist   Preanesthetic Checklist Completed: patient identified, IV checked, site marked, risks and benefits discussed, surgical consent, monitors and equipment checked, pre-op evaluation and timeout performed  Epidural Patient position: sitting Prep: ChloraPrep Patient monitoring: heart rate, continuous pulse ox and blood pressure Approach: midline Location: L3-L4 Injection technique: LOR saline  Needle:  Needle type: Tuohy  Needle gauge: 17 G Needle length: 9 cm and 9 Needle insertion depth: 5 cm Catheter type: closed end flexible Catheter size: 19 Gauge Catheter at skin depth: 10 cm Test dose: negative and 1.5% lidocaine with Epi 1:200 K  Assessment Sensory level: T10 Events: blood not aspirated, injection not painful, no injection resistance, no paresthesia and negative IV test  Additional Notes 1st attempt Pt. Evaluated and documentation done after procedure finished. Patient identified. Risks/Benefits/Options discussed with patient including but not limited to bleeding, infection, nerve damage, paralysis, failed block, incomplete pain control, headache, blood pressure changes, nausea, vomiting, reactions to medication both or allergic, itching and postpartum back pain. Confirmed with bedside nurse the patient's most recent platelet count. Confirmed with patient that they are not currently taking any anticoagulation, have any bleeding history or any family history of bleeding disorders. Patient expressed understanding and wished to proceed. All questions were answered. Sterile technique was used throughout the entire procedure. Please see nursing notes for vital signs. Test dose was given through epidural catheter and negative prior to continuing to dose epidural or start infusion. Warning signs of high  block given to the patient including shortness of breath, tingling/numbness in hands, complete motor block, or any concerning symptoms with instructions to call for help. Patient was given instructions on fall risk and not to get out of bed. All questions and concerns addressed with instructions to call with any issues or inadequate analgesia.    Patient tolerated the insertion well without immediate complications.Reason for block:procedure for pain

## 2022-02-06 NOTE — Progress Notes (Signed)
  Labor Progress Note   26 y.o. [id number] @ [redacted]w[redacted]d , admitted for  Pregnancy, Labor Management.   Subjective:  Comfortable with epidural  Objective:  BP (!) 108/56   Pulse (!) 56   Temp 98 F (36.7 C) (Oral)   Resp 16   Ht 5\' 6"  (1.676 m)   Wt 76 kg   LMP 04/26/2021 (Exact Date)   BMI 27.04 kg/m  Abd: gravid, ND, FHT present, mild tenderness on exam Extr: no edema SVE: CERVIX: anterior lip dilated, complete effaced, +1 station  EFM: FHR: 140 bpm, variability: moderate,  accelerations:  Present,  decelerations:  Absent Toco: Frequency: Every 1-3 minutes Labs: I have reviewed the patient's lab results.   Assessment & Plan:  G4P0030 @ [redacted]w[redacted]d, admitted for  Pregnancy and Labor/Delivery Management  1. Pain management: epidural. 2. FWB: FHT category I.  3. ID: GBS negative 4. Labor management: Reassess readiness for pushing in 30 minutes. Dr [redacted]w[redacted]d will deliver.  All discussed with patient, see orders   Valentino Saxon, CNM [facility] 02/06/2022  9:54 PM

## 2022-02-06 NOTE — Anesthesia Preprocedure Evaluation (Signed)
Anesthesia Evaluation  Patient identified by MRN, date of birth, ID band Patient awake    Reviewed: Allergy & Precautions, H&P , NPO status , Patient's Chart, lab work & pertinent test results, reviewed documented beta blocker date and time   History of Anesthesia Complications Negative for: history of anesthetic complications  Airway Mallampati: I  TM Distance: >3 FB Neck ROM: full    Dental  (+) Dental Advidsory Given, Teeth Intact   Pulmonary neg shortness of breath, asthma , neg sleep apnea, neg recent URI,    Pulmonary exam normal breath sounds clear to auscultation       Cardiovascular Exercise Tolerance: Good negative cardio ROS Normal cardiovascular exam Rhythm:regular Rate:Normal     Neuro/Psych negative neurological ROS  negative psych ROS   GI/Hepatic Neg liver ROS, GERD  ,  Endo/Other  negative endocrine ROS  Renal/GU negative Renal ROS  negative genitourinary   Musculoskeletal   Abdominal   Peds  Hematology negative hematology ROS (+)   Anesthesia Other Findings Past Medical History: No date: Asthma No date: Snapping hip syndrome   Reproductive/Obstetrics negative OB ROS                             Anesthesia Physical Anesthesia Plan  ASA: 2  Anesthesia Plan: Epidural   Post-op Pain Management:    Induction:   PONV Risk Score and Plan:   Airway Management Planned:   Additional Equipment:   Intra-op Plan:   Post-operative Plan:   Informed Consent: I have reviewed the patients History and Physical, chart, labs and discussed the procedure including the risks, benefits and alternatives for the proposed anesthesia with the patient or authorized representative who has indicated his/her understanding and acceptance.     Dental Advisory Given  Plan Discussed with: Anesthesiologist, CRNA and Surgeon  Anesthesia Plan Comments:         Anesthesia Quick  Evaluation

## 2022-02-07 ENCOUNTER — Encounter: Payer: Self-pay | Admitting: Obstetrics and Gynecology

## 2022-02-07 LAB — CBC
HCT: 32.4 % — ABNORMAL LOW (ref 36.0–46.0)
Hemoglobin: 10.8 g/dL — ABNORMAL LOW (ref 12.0–15.0)
MCH: 28.7 pg (ref 26.0–34.0)
MCHC: 33.3 g/dL (ref 30.0–36.0)
MCV: 86.2 fL (ref 80.0–100.0)
Platelets: 219 10*3/uL (ref 150–400)
RBC: 3.76 MIL/uL — ABNORMAL LOW (ref 3.87–5.11)
RDW: 13.2 % (ref 11.5–15.5)
WBC: 19.7 10*3/uL — ABNORMAL HIGH (ref 4.0–10.5)
nRBC: 0 % (ref 0.0–0.2)

## 2022-02-07 LAB — RPR: RPR Ser Ql: NONREACTIVE

## 2022-02-07 MED ORDER — PRENATAL MULTIVITAMIN CH
1.0000 | ORAL_TABLET | Freq: Every day | ORAL | Status: DC
  Administered 2022-02-07 – 2022-02-08 (×2): 1 via ORAL
  Filled 2022-02-07 (×2): qty 1

## 2022-02-07 MED ORDER — IBUPROFEN 600 MG PO TABS
600.0000 mg | ORAL_TABLET | Freq: Four times a day (QID) | ORAL | Status: DC
  Administered 2022-02-07 – 2022-02-08 (×6): 600 mg via ORAL
  Filled 2022-02-07 (×6): qty 1

## 2022-02-07 MED ORDER — WITCH HAZEL-GLYCERIN EX PADS
1.0000 | MEDICATED_PAD | CUTANEOUS | Status: DC | PRN
  Administered 2022-02-07: 1 via TOPICAL
  Filled 2022-02-07: qty 100

## 2022-02-07 MED ORDER — DIBUCAINE (PERIANAL) 1 % EX OINT: 1.0000 | TOPICAL_OINTMENT | CUTANEOUS | Status: DC | PRN

## 2022-02-07 MED ORDER — ACETAMINOPHEN 325 MG PO TABS: 650.0000 mg | ORAL_TABLET | ORAL | Status: DC | PRN

## 2022-02-07 MED ORDER — BENZOCAINE-MENTHOL 20-0.5 % EX AERO
1.0000 | INHALATION_SPRAY | CUTANEOUS | Status: DC | PRN
  Administered 2022-02-07: 1 via TOPICAL
  Filled 2022-02-07: qty 56

## 2022-02-07 MED ORDER — ONDANSETRON HCL 4 MG/2ML IJ SOLN: 4.0000 mg | INTRAMUSCULAR | Status: DC | PRN

## 2022-02-07 MED ORDER — ZOLPIDEM TARTRATE 5 MG PO TABS: 5.0000 mg | ORAL_TABLET | Freq: Every evening | ORAL | Status: DC | PRN

## 2022-02-07 MED ORDER — SIMETHICONE 80 MG PO CHEW
80.0000 mg | CHEWABLE_TABLET | ORAL | Status: DC | PRN
  Administered 2022-02-07: 80 mg via ORAL
  Filled 2022-02-07: qty 1

## 2022-02-07 MED ORDER — COCONUT OIL OIL: 1.0000 | TOPICAL_OIL | Status: DC | PRN

## 2022-02-07 MED ORDER — DIPHENHYDRAMINE HCL 25 MG PO CAPS: 25.0000 mg | ORAL_CAPSULE | Freq: Four times a day (QID) | ORAL | Status: DC | PRN

## 2022-02-07 MED ORDER — SENNOSIDES-DOCUSATE SODIUM 8.6-50 MG PO TABS
2.0000 | ORAL_TABLET | Freq: Every day | ORAL | Status: DC
  Administered 2022-02-07 – 2022-02-08 (×2): 2 via ORAL
  Filled 2022-02-07 (×2): qty 2

## 2022-02-07 MED ORDER — ONDANSETRON HCL 4 MG PO TABS: 4.0000 mg | ORAL_TABLET | ORAL | Status: DC | PRN

## 2022-02-07 NOTE — Progress Notes (Signed)
Progress Note - Vaginal Delivery  Anna Fox is a 26 y.o. [phone] now PP day 1 s/p Vaginal, Spontaneous .   Subjective:  The patient reports no complaints, up ad lib, voiding, and tolerating PO   Objective:  Vital signs in last 24 hours: Temp:  [97.7 F (36.5 C)-98.4 F (36.9 C)] 97.7 F (36.5 C) (07/20 1520) Pulse Rate:  [55-194] 71 (07/20 1520) Resp:  [17-20] 18 (07/20 1520) BP: (98-142)/(70-91) 133/84 (07/20 1520) SpO2:  [96 %-100 %] 100 % (07/20 0827)  Physical Exam:  General: alert, cooperative, appears stated age, and no distress Lochia: appropriate Uterine Fundus: firm    Data Review Recent Labs    02/06/22 1100 02/07/22 0621  HGB 11.5* 10.8*  HCT 34.6* 32.4*    Assessment/Plan: Principal Problem:   Post-dates pregnancy Active Problems:   Asthma in adult, moderate persistent, uncomplicated   Marijuana abuse in remission   GBS (group B Streptococcus carrier), +RV culture, currently pregnant   Plan for discharge tomorrow   -- Continue routine PP care.     Doreene Burke, CNM 02/07/2022

## 2022-02-07 NOTE — Anesthesia Postprocedure Evaluation (Signed)
Anesthesia Post Note  Patient: Anna Fox  Procedure(s) Performed: AN AD HOC LABOR EPIDURAL  Patient location during evaluation: Mother Baby Anesthesia Type: Epidural Level of consciousness: awake and alert Pain management: pain level controlled Vital Signs Assessment: post-procedure vital signs reviewed and stable Respiratory status: spontaneous breathing, nonlabored ventilation and respiratory function stable Cardiovascular status: stable Postop Assessment: no headache, no backache and epidural receding Anesthetic complications: no   No notable events documented.   Last Vitals:  Vitals:   02/07/22 0210 02/07/22 0320  BP: 115/71 111/71  Pulse: 61 (!) 55  Resp: 18 17  Temp: 36.6 C 36.9 C  SpO2: 100% 100%    Last Pain:  Vitals:   02/07/22 0320  TempSrc: Axillary  PainSc: 0-No pain                 Tobechukwu Emmick Lawerance Cruel

## 2022-02-07 NOTE — Lactation Note (Signed)
This note was copied from a baby's chart. Lactation Consultation Note  Patient Name: Anna Fox Date: 02/07/2022 Reason for consult: Initial assessment;Primapara;Term;Breastfeeding assistance;RN request Age:24 hours  Maternal Data This is mom's first baby, NSVD. Mom's feeding plan is breastfeeding. Per mom baby has been latching and breastfeeding since baby was born. Mom has questions about when she should begin pumping. Has patient been taught Hand Expression?: Yes Does the patient have breastfeeding experience prior to this delivery?: No  Feeding Mother's feeding choice is to breastfeed.   Lactation Tools Discussed/Used  Mother has personal electric breast pump for home use. Discussed options for pumping to accumulate milk for when she returns to work. 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 Discussed for now to continue to breastfeed. Provided mom options to accumulate milk once baby has returned to birth weight and during the period when her milk transitions in if the baby only takes one breast she can pump the breast to comfort that the baby did not feed on if she wants to start pumping before 2 weeks.  Interventions Interventions: Breast feeding basics reviewed;Education  Discharge Discharge Education: Engorgement and breast care;Warning signs for feeding baby;Other (comment) (Mother bringing baby for [facility] at [facility]. Mother informed outpatient LC assistance is available at pediatric practice.) Pump: Personal (Mom has a personal use pump for home use.)  Consult Status Consult Status: Follow-up Date: 02/08/22 Follow-up type: In-patient  Update provided to care nurse.  Fuller Song 02/07/2022, 5:39 PM

## 2022-02-07 NOTE — TOC Initial Note (Signed)
Transition of Care [facility]) - Initial/Assessment Note    Patient Details  Name: BOBIE CARIS MRN: [id number] Date of Birth: [birth year]  Transition [facility]) CM/SW Contact:    Allayne Butcher, RN Phone Number: 02/07/2022, 7:00 PM  Clinical Narrative:                 [facility] consult for drug exposed newborn, mother and baby neither tested positive on UDS.  Last time mother tested positive for marijuana was back in Feb.  She has since quit smoking and does not plan on starting back. Baby's name is Baldemar Friday, father is Yevonne Aline 79 years old.  They live with patient's mother and her 59 year old sister at [address]  [address] in [place].  That is not their mailing address because they do not plan on staying at this address for long.  Patient works at [employer].  She has maternity leave, her significant other is not currently working.    Home is prepared for the baby. Since UDS was was negative on patient and baby, no CPS report will be made but will follow cord blood results.          Patient Goals and CMS Choice        Expected Discharge Plan and Services                                                Prior Living Arrangements/Services                       Activities of Daily Living Home Assistive Devices/Equipment: None ADL Screening (condition at time of admission) Patient's cognitive ability adequate to safely complete daily activities?: Yes Is the patient deaf or have difficulty hearing?: No Does the patient have difficulty seeing, even when wearing glasses/contacts?: No Does the patient have difficulty concentrating, remembering, or making decisions?: No Patient able to express need for assistance with ADLs?: Yes Does the patient have difficulty dressing or bathing?: No Independently performs ADLs?: Yes (appropriate for developmental age) Does the patient have difficulty walking or climbing stairs?: No Weakness of Legs:  None Weakness of Arms/Hands: None  Permission Sought/Granted                  Emotional Assessment              Admission diagnosis:  Post-dates pregnancy [O48.0] Patient Active Problem List   Diagnosis Date Noted   Post-dates pregnancy 02/06/2022   Marijuana abuse in remission 02/06/2022   GBS (group B Streptococcus carrier), +RV culture, currently pregnant 02/06/2022   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 [place] [address] Phone: [phone] Fax: [phone]     Social Determinants of Health (SDOH) Interventions    Readmission Risk Interventions     No data to display

## 2022-02-07 NOTE — Discharge Summary (Addendum)
Postpartum Discharge Summary  Date of Service updated 02/08/2022     Patient Name: Anna Fox DOB: 09-01-1995 MRN: [id number]  Date of admission: 02/06/2022 Delivery date:02/06/2022  Delivering provider: Rubie Maid  Date of discharge: 02/08/2022  Admitting diagnosis: Post-dates pregnancy [O48.0] Intrauterine pregnancy: [redacted]w[redacted]d    Secondary diagnosis:  Principal Problem:   Post-dates pregnancy Active Problems:   Asthma in adult, moderate persistent, uncomplicated   Marijuana abuse in remission   GBS (group B Streptococcus carrier), +RV culture, currently pregnant  Additional problems: None    Discharge diagnosis: Term Pregnancy Delivered                                              Post partum procedures: None Augmentation: Cytotec Complications: None  Hospital course: Induction of Labor With Vaginal Delivery   26y.o. yo [phone] [id number] admitted to the hospital 02/06/2022 for induction of labor.  Indication for induction: Postdates.  Patient had an uncomplicated labor course as follows: Membrane Rupture Time/Date: 9:50 PM ,02/06/2022   Delivery Method:Vaginal, Spontaneous  Episiotomy: None  Lacerations:  Vaginal  Details of delivery can be found in separate delivery note.  Patient had a routine postpartum course. She is ambulating and urinating without difficulty. Has a good appetite. Breastfeeding independently.  Partner present and has support at home. Is ready to go home.  Patient is discharged home 02/08/22.  Newborn Data: Birth date:02/06/2022  Birth time:10:53 PM  Gender:Female  Living status:Living  Apgars:8 ,9  Weight:3450 g   Magnesium Sulfate received: No BMZ received: No Rhophylac:No MMR:No T-DaP:Given prenatally Flu: No Transfusion:No  Physical exam  Vitals:   02/07/22 1212 02/07/22 1520 02/07/22 2328 02/08/22 0854  BP: 140/84 133/84 119/86 132/84  Pulse: 69 71 62 70  Resp: 18 18 18 20   Temp: 97.9 F (36.6 C) 97.7 F (36.5 C)  98 F  (36.7 C)  TempSrc: Oral Oral  Oral  SpO2:   99% 99%  Weight:      Height:       General: alert Breasts: soft, no redness, nipples erect and intact bilaterally Lochia: appropriate Uterine Fundus: firm Incision: Perineum slightly swollen  Extremities: trace BLE DVT Evaluation: No evidence of DVT seen on physical exam. Labs: Lab Results  Component Value Date   WBC 19.7 (H) 02/07/2022   HGB 10.8 (L) 02/07/2022   HCT 32.4 (L) 02/07/2022   MCV 86.2 02/07/2022   PLT 219 02/07/2022      Latest Ref Rng & Units 04/16/2018    5:49 AM  CMP  Glucose 70 - 99 mg/dL 100   BUN 6 - 20 mg/dL 18   Creatinine 0.44 - 1.00 mg/dL 1.23   Sodium 135 - 145 mmol/L 139   Potassium 3.5 - 5.1 mmol/L 4.4   Chloride 98 - 111 mmol/L 108   CO2 22 - 32 mmol/L 25   Calcium 8.9 - 10.3 mg/dL 9.0    Edinburgh Score:    02/08/2022   12:30 AM  Edinburgh Postnatal Depression Scale Screening Tool  I have been able to laugh and see the funny side of things. 0  I have looked forward with enjoyment to things. 1  I have blamed myself unnecessarily when things went wrong. 0  I have been anxious or worried for no good reason. 0  I have felt scared or  panicky for no good reason. 0  Things have been getting on top of me. 1  I have been so unhappy that I have had difficulty sleeping. 1  I have felt sad or miserable. 1  I have been so unhappy that I have been crying. 1  The thought of harming myself has occurred to me. 0  Edinburgh Postnatal Depression Scale Total 5      After visit meds:  Allergies as of 02/08/2022   No Known Allergies      Medication List     TAKE these medications    acetaminophen 325 MG tablet Commonly known as: Tylenol Take 2 tablets (650 mg total) by mouth every 4 (four) hours as needed (for pain scale < 4).   AeroChamber Plus inhaler Use as instructed   albuterol 108 (90 Base) MCG/ACT inhaler Commonly known as: VENTOLIN HFA Inhale 2 puffs into the lungs every 6 (six) hours  as needed for wheezing.   benzocaine-Menthol 20-0.5 % Aero Commonly known as: DERMOPLAST Apply 1 Application topically as needed for irritation (perineal discomfort).   dibucaine 1 % Oint Commonly known as: NUPERCAINAL Place 1 Application rectally as needed for hemorrhoids.   fluticasone-salmeterol 250-50 MCG/ACT Aepb Commonly known as: ADVAIR Inhale into the lungs.   ibuprofen 600 MG tablet Commonly known as: ADVIL Take 1 tablet (600 mg total) by mouth every 6 (six) hours.   montelukast 10 MG tablet Commonly known as: SINGULAIR Take 10 mg by mouth daily.   prenatal multivitamin Tabs tablet Take 1 tablet by mouth daily at 12 noon. Start taking on: February 09, 2022 What changed: medication strength   UNABLE TO FIND Outpatient physical therapy Dx: ataxia Evaluate and treat   witch hazel-glycerin pad Commonly known as: TUCKS Apply 1 Application topically as needed for hemorrhoids.          Discharge home in stable condition Infant Feeding: Breast Infant Disposition:home with mother Discharge instruction: per After Visit Summary and Postpartum booklet. Activity: Advance as tolerated. Pelvic rest for 6 weeks.  Diet: routine diet Anticipated Birth Control: Unsure pills? Postpartum Appointment:6 weeks Additional Postpartum F/U: Postpartum Depression checkup Future Appointments:No future appointments. Follow up Visit:     Roberto Scales, [place]  02/08/2022 1:31 PM

## 2022-02-08 MED ORDER — ACETAMINOPHEN 500 MG PO TABS: 1000.0000 mg | ORAL_TABLET | Freq: Four times a day (QID) | ORAL | Status: DC

## 2022-02-08 MED ORDER — BENZOCAINE-MENTHOL 20-0.5 % EX AERO: 1.0000 | INHALATION_SPRAY | CUTANEOUS | Status: DC | PRN

## 2022-02-08 MED ORDER — DOCUSATE SODIUM 100 MG PO CAPS: 100.0000 mg | ORAL_CAPSULE | Freq: Two times a day (BID) | ORAL | Status: DC

## 2022-02-08 MED ORDER — IBUPROFEN 600 MG PO TABS
600.0000 mg | ORAL_TABLET | Freq: Four times a day (QID) | ORAL | Status: DC
  Administered 2022-02-08: 600 mg via ORAL
  Filled 2022-02-08: qty 1

## 2022-02-08 MED ORDER — PRENATAL MULTIVITAMIN CH: 1.0000 | ORAL_TABLET | Freq: Every day | ORAL | Status: AC

## 2022-02-08 MED ORDER — ONDANSETRON HCL 4 MG PO TABS: 4.0000 mg | ORAL_TABLET | ORAL | Status: DC | PRN

## 2022-02-08 MED ORDER — ZOLPIDEM TARTRATE 5 MG PO TABS: 5.0000 mg | ORAL_TABLET | Freq: Every evening | ORAL | Status: DC | PRN

## 2022-02-08 MED ORDER — WITCH HAZEL-GLYCERIN EX PADS: 1.0000 | MEDICATED_PAD | CUTANEOUS | 12 refills | Status: DC | PRN

## 2022-02-08 MED ORDER — IBUPROFEN 600 MG PO TABS: 600.0000 mg | ORAL_TABLET | Freq: Four times a day (QID) | ORAL | 0 refills | Status: DC

## 2022-02-08 MED ORDER — COCONUT OIL OIL
1.0000 | TOPICAL_OIL | Status: DC | PRN
Start: 2022-02-08 — End: 2022-02-09

## 2022-02-08 MED ORDER — ACETAMINOPHEN 325 MG PO TABS: 650.0000 mg | ORAL_TABLET | ORAL | Status: DC | PRN

## 2022-02-08 MED ORDER — ONDANSETRON HCL 4 MG/2ML IJ SOLN: 4.0000 mg | INTRAMUSCULAR | Status: DC | PRN

## 2022-02-08 MED ORDER — PRENATAL MULTIVITAMIN CH: 1.0000 | ORAL_TABLET | Freq: Every day | ORAL | Status: DC

## 2022-02-08 MED ORDER — SIMETHICONE 80 MG PO CHEW: 80.0000 mg | CHEWABLE_TABLET | ORAL | Status: DC | PRN

## 2022-02-08 MED ORDER — DIBUCAINE (PERIANAL) 1 % EX OINT: 1.0000 | TOPICAL_OINTMENT | CUTANEOUS | Status: DC | PRN

## 2022-02-08 MED ORDER — DIPHENHYDRAMINE HCL 25 MG PO CAPS: 25.0000 mg | ORAL_CAPSULE | Freq: Four times a day (QID) | ORAL | Status: DC | PRN

## 2022-02-08 MED ORDER — WITCH HAZEL-GLYCERIN EX PADS: 1.0000 | MEDICATED_PAD | CUTANEOUS | Status: DC | PRN

## 2022-02-08 MED ORDER — ACETAMINOPHEN 500 MG PO TABS: 1000.0000 mg | ORAL_TABLET | Freq: Four times a day (QID) | ORAL | 0 refills | Status: DC

## 2022-02-08 NOTE — Progress Notes (Signed)
Patient discharged home with infant. Discharge instructions, prescriptions, and follow-up appointment were given to and reviewed with patient. IV removed. Patient verbalized understanding. Escorted out by Psychologist, sport and exercise.

## 2022-02-08 NOTE — Lactation Note (Signed)
This note was copied from a baby's chart. Lactation Consultation Note  Patient Name: Anna Fox [id number] Date: 02/08/2022 Reason for consult: Follow-up assessment Age:24 hours  Maternal Data See initial consult Today mom reports baby cluster fed until 3:20 am and hasn't breastfed since. Mom reports she began using a pacifier during the night because she understood the nurse to say the baby was possible using her breast as a pacifier.The baby quieted with the pacifier but has not latched since the last feed.  Has patient been taught Hand Expression?: Yes Does the patient have breastfeeding experience prior to this delivery?: No  Feeding Mother's Current Feeding Choice: Breast Milk Unwrapped the baby. Baby appeared initially asleep now awake and alert. Baby readily latched to mom's right breast. Assisted mom with maximizing latch and positioning technique. Baby with tendency to roll upper lip in. Mom able to identify audible swallows. Encouraged mom to support her breast and use some breast compression to encourage flow and keep baby interested.Also, recommended mom use tactile stimulation to encourage baby during feeding. Mom was able to independently latch and position baby at the right breast.  LATCH Score Latch: Grasps breast easily, tongue down, lips flanged, rhythmical sucking.  Audible Swallowing: Spontaneous and intermittent  Type of Nipple: Everted at rest and after stimulation  Comfort (Breast/Nipple): Soft / non-tender  Hold (Positioning): Assistance needed to correctly position infant at breast and maintain latch.  LATCH Score: 9     Interventions Interventions: Breast feeding basics reviewed;Assisted with latch;Hand express;Breast compression;Adjust position;Support pillows;Position options;Education Reviewed normative cluster feeding , what to expect the first days when breastfeeding, avoidance of pacifier use until breastfeeding is established, and how to know  the baby is getting enough.  Discharge Discharge Education: Engorgement and breast care;Warning signs for feeding baby;Other (comment) (Provided [facility] lactation info as an additional resource for mom.) Pump: Personal (Mom has personal use pump at home.)  Consult Status Consult Status: PRN Date: 02/08/22 Follow-up type: In-patient  Update provided to care nurse.  Fuller Song 02/08/2022, 12:45 PM

## 2022-02-20 ENCOUNTER — Telehealth (INDEPENDENT_AMBULATORY_CARE_PROVIDER_SITE_OTHER): Payer: BC Managed Care – PPO | Admitting: Obstetrics and Gynecology

## 2022-02-20 ENCOUNTER — Encounter: Payer: Self-pay | Admitting: Obstetrics and Gynecology

## 2022-02-20 VITALS — Ht 66.0 in

## 2022-02-20 DIAGNOSIS — Z1332 Encounter for screening for maternal depression: Secondary | ICD-10-CM

## 2022-02-20 DIAGNOSIS — O924 Hypogalactia: Secondary | ICD-10-CM

## 2022-02-20 NOTE — Progress Notes (Signed)
   Virtual Visit via Video Note  I connected with NAME@ on 02/20/22 at   5:00 PM EDT by a video enabled telemedicine application and verified that I am speaking with the correct person using two identifiers.  Location: Patient: Home Provider: office   I discussed the limitations of evaluation and management by telemedicine and the availability of in person appointments. The patient expressed understanding and agreed to proceed.    History of Present Illness:   Anna Fox is a 26 y.o. [phone] female who presents for a 2 week televisit for mood check. She is 2 weeks postpartum following a spontaneous vaginal delivery.  The delivery was at 40 gestational weeks.  Postpartum course has been well so far. Baby is feeding by both breast/bottle with Gerber Gentle Pro. Bleeding: Stopped. Postpartum depression screening: negative.  EDPS score is 3.    Anna Fox reports issues with low milk supply. Usually only ~ 1 ounce or so per breast. Has tried warm showers, pumping every 2-3 hours, and increasing her water intake.    The following portions of the patient's history were reviewed and updated as appropriate: allergies, current medications, past family history, past medical history, past social history, past surgical history, and problem list.   Observations/Objective:   Wt Readings from Last 3 Encounters:  02/06/22 167 lb 8 oz (76 kg)  01/30/22 167 lb 8 oz (76 kg)  01/23/22 169 lb 8 oz (76.9 kg)   Height 5\' 6"  (1.676 m), currently breastfeeding.   Gen App: NAD Psych: normal speech, affect. Good mood.        02/20/2022    7:37 PM 02/08/2022   12:30 AM  Edinburgh Postnatal Depression Scale Screening Tool  I have been able to laugh and see the funny side of things. 0 0  I have looked forward with enjoyment to things. 0 1  I have blamed myself unnecessarily when things went wrong. 1 0  I have been anxious or worried for no good reason. 0 0  I have felt scared or panicky for no good  reason. 0 0  Things have been getting on top of me. 1 1  I have been so unhappy that I have had difficulty sleeping. 0 1  I have felt sad or miserable. 1 1  I have been so unhappy that I have been crying. 0 1  The thought of harming myself has occurred to me. 0 0  Edinburgh Postnatal Depression Scale Total 3 5         Assessment and Plan:   1. Encounter for screening for maternal depression - Screening Negative today. Will rescreen at 6 week postpartum visit. Overall doing well.    2. Postpartum state - Overall doing well. Continue routine postpartum home care.    3. Lactating mother - Patient notes low milk supply. Advised on oatmeal intake, Fenugreek supplementation, brewers yeast.    Follow Up Instructions:     I discussed the assessment and treatment plan with the patient. The patient was provided an opportunity to ask questions and all were answered. The patient agreed with the plan and demonstrated an understanding of the instructions.   The patient was advised to call back or seek an in-person evaluation if the symptoms worsen or if the condition fails to improve as anticipated.   I provided 6 minutes of non-face-to-face time during this encounter.     02/10/2022, MD [facility]

## 2022-02-20 NOTE — Patient Instructions (Signed)

## 2022-03-11 ENCOUNTER — Telehealth: Payer: Self-pay | Admitting: Obstetrics and Gynecology

## 2022-03-11 DIAGNOSIS — Z9189 Other specified personal risk factors, not elsewhere classified: Secondary | ICD-10-CM

## 2022-03-11 MED ORDER — METOCLOPRAMIDE HCL 10 MG PO TABS: 10.0000 mg | ORAL_TABLET | Freq: Three times a day (TID) | ORAL | 0 refills | Status: AC

## 2022-03-11 NOTE — Telephone Encounter (Signed)
Patient is calling requesting medication to be sent in to help her with her breast milk to come in. Patient is aware Dr. Valentino Saxon is out of the office today and will return tomorrow. Please advise?

## 2022-03-11 NOTE — Telephone Encounter (Signed)
Called patient to inform her that Reglan was sent to pharmacy and to go over decrease milk supply protocol. She verbalized understanding.

## 2022-03-11 NOTE — Telephone Encounter (Signed)
Can prescribe Reglan per protocol.

## 2022-03-20 ENCOUNTER — Ambulatory Visit (INDEPENDENT_AMBULATORY_CARE_PROVIDER_SITE_OTHER): Payer: BC Managed Care – PPO | Admitting: Obstetrics and Gynecology

## 2022-03-20 ENCOUNTER — Encounter: Payer: Self-pay | Admitting: Obstetrics and Gynecology

## 2022-03-20 DIAGNOSIS — O924 Hypogalactia: Secondary | ICD-10-CM | POA: Diagnosis not present

## 2022-03-20 DIAGNOSIS — Z30011 Encounter for initial prescription of contraceptive pills: Secondary | ICD-10-CM | POA: Diagnosis not present

## 2022-03-20 MED ORDER — SLYND 4 MG PO TABS: 1.0000 | ORAL_TABLET | Freq: Every day | ORAL | 3 refills | Status: DC

## 2022-03-20 NOTE — Progress Notes (Signed)
   OBSTETRICS POSTPARTUM CLINIC PROGRESS NOTE  Subjective:     Anna Fox is a 26 y.o. [phone] female who presents for a postpartum visit. She is 6 weeks postpartum following a spontaneous vaginal delivery. I have fully reviewed the prenatal and intrapartum course. The delivery was at 40.6 gestational weeks.  Anesthesia: epidural. Postpartum course has been going well. Baby's course has been good. Baby is feeding by both breast and bottle - Gerber .  Currently taking Reglan to help increase milk supply, notes she is still only producing ~ 1-2 oz in each breast. Bleeding: patient has resumed menses, with Patient's last menstrual period was 03/16/2022 (exact date). Bowel function is normal. Bladder function is normal. Patient is sexually active. Contraception method desired is OCP (estrogen/progesterone). Postpartum depression screening: negative.  EDPS score is 5.    The following portions of the patient's history were reviewed and updated as appropriate: allergies, current medications, past family history, past medical history, past social history, past surgical history, and problem list.  Review of Systems Pertinent items noted in HPI and remainder of comprehensive ROS otherwise negative.   Objective:    BP 118/63   Pulse 74   Resp 16   Ht 5\' 6"  (1.676 m)   Wt 164 lb (74.4 kg)   LMP 03/16/2022 (Exact Date)   Breastfeeding Yes   BMI 26.47 kg/m   General:  alert and no distress   Breasts:  inspection negative, no nipple discharge or bleeding, no masses or nodularity palpable  Lungs: clear to auscultation bilaterally  Heart:  regular rate and rhythm, S1, S2 normal, no murmur, click, rub or gallop  Abdomen: soft, non-tender; bowel sounds normal; no masses,  no organomegaly.     Vulva:  normal  Vagina: normal vagina, no discharge, exudate, lesion, or erythema  Cervix:  no cervical motion tenderness and no lesions  Corpus: normal size, contour, position, consistency, mobility,  non-tender  Adnexa:  normal adnexa and no mass, fullness, tenderness  Rectal Exam: Not performed.         Labs:  Lab Results  Component Value Date   HGB 10.8 (L) 02/07/2022     Assessment:   1. Postpartum care following vaginal delivery   2. Decreased lactation   3. Initiation of oral contraception      Plan:    1. Contraception: Discussion had with patient regarding contraception options. As she is having some difficulty with milk supply, would recommend progesterone-only methods.  Patient ok to use oral progesterone-only contraceptive. Given prescription for [facility], to begin Sunday start.  2. Patient with decreased lactation, currently utilizing Reglan to increase her milk supply.  Also continue to reiterate use of oat and a yeast containing products, lactation cookies, and fenugreek.  Strongly encouraged increased hydration. 3. Follow up in:  4-6  months for annual exam either with GYN or PCP, or sooner as needed.    Tuesday, MD [facility]

## 2022-05-24 ENCOUNTER — Other Ambulatory Visit: Payer: Self-pay

## 2022-05-24 DIAGNOSIS — Z30011 Encounter for initial prescription of contraceptive pills: Secondary | ICD-10-CM

## 2022-05-24 MED ORDER — SLYND 4 MG PO TABS: 1.0000 | ORAL_TABLET | Freq: Every day | ORAL | 3 refills | Status: AC

## 2024-02-16 ENCOUNTER — Ambulatory Visit: Admission: EM | Admit: 2024-02-16 | Discharge: 2024-02-16 | Disposition: A | Discharge status: Home / Self Care

## 2024-02-16 DIAGNOSIS — L02411 Cutaneous abscess of right axilla: Secondary | ICD-10-CM | POA: Diagnosis not present

## 2024-02-16 MED ORDER — DOXYCYCLINE HYCLATE 100 MG PO CAPS: 100.0000 mg | ORAL_CAPSULE | Freq: Two times a day (BID) | ORAL | 0 refills | Status: AC

## 2024-02-16 NOTE — Discharge Instructions (Addendum)
-  Change bandage daily and clean area with soap and water.  -May use warm compresses to help area drain more -Take full course of antibiotics -Return is not continuing to improve or if symptoms worsen

## 2024-02-16 NOTE — ED Triage Notes (Signed)
Abscess under right arm x 1 week.

## 2024-02-16 NOTE — ED Provider Notes (Signed)
 [facility]    CSN: [id number] Arrival date & time: 02/16/24  9062      History   Chief Complaint Chief Complaint  Patient presents with   Abscess    HPI Anna Fox is a 28 y.o. female presenting for an area of swelling, redness and pain of the right axilla x 1 week. Area has not changed in size. No drainage. Has been applying warm compresses without relief. No history of abscesses, cysts or MRSA.  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 Authorizing Provider  albuterol  (VENTOLIN  HFA) 108 (90 Base) MCG/ACT inhaler Inhale 2 puffs into the lungs every 6 (six) hours as needed for wheezing. 01/09/17  Yes [provider]  budesonide-formoterol  (SYMBICORT) 160-4.5 MCG/ACT inhaler Inhale 2 puffs into the lungs. 01/05/24  Yes [provider]  doxycycline  (VIBRAMYCIN ) 100 MG capsule Take 1 capsule (100 mg total) by mouth 2 (two) times daily for 7 days. 02/16/24 02/23/24 Yes Arvis Jolan NOVAK, PA-C  Drospirenone  (SLYND ) 4 MG TABS Take 1 tablet by mouth daily. 05/24/22   Connell Davies, MD  fluticasone-salmeterol (ADVAIR) 250-50 MCG/ACT AEPB Inhale into the lungs. 08/27/21   [provider]  metoCLOPramide  (REGLAN ) 10 MG tablet Take 1 tablet (10 mg total) by mouth 3 (three) times daily. 03/11/22 04/10/22  Connell Davies, MD  montelukast (SINGULAIR) 10 MG tablet Take 10 mg by mouth daily. 06/12/21   [provider]  Prenatal Vit-Fe Fumarate-FA (PRENATAL MULTIVITAMIN) TABS tablet Take 1 tablet by mouth daily at 12 noon. 02/09/22   Dominic, Jinnie Jansky, CNM  Spacer/Aero-Holding Chambers (AEROCHAMBER PLUS) inhaler Use as instructed 03/22/20   Van Knee, MD  UNABLE TO FIND Outpatient physical therapy Dx: ataxia Evaluate and treat 04/16/18   Juvenal Harlene PENNER, DO    Family History Family History  Problem Relation Age of Onset   Healthy Mother    Cancer - Colon Father    Stroke Maternal Grandfather    Lung cancer Paternal Grandmother     Social History Social History   Tobacco Use   Smoking status: Never   Smokeless tobacco: Never  Vaping Use   Vaping status: Never Used  Substance Use Topics   Alcohol use: No   Drug use: Yes    Types: Marijuana    Comment: last mj use - end of 05/2021     Allergies   Patient  has no known allergies.   Review of Systems Review of Systems  Constitutional:  Negative for fever.  Skin:  Positive for color change.     Physical Exam Triage Vital Signs ED Triage Vitals  Encounter Vitals Group     BP 02/16/24 0954 115/76     Girls Systolic BP Percentile --      Girls Diastolic BP Percentile --      Boys Systolic BP Percentile --      Boys Diastolic BP Percentile --      Pulse Rate 02/16/24 0954 (!) 58     Resp 02/16/24 0954 17     Temp 02/16/24 0954 98.3 F (36.8 C)     Temp Source 02/16/24 0954 Oral     SpO2 02/16/24 0954 98 %     Weight --      Height --      Head Circumference --      Peak Flow --      Pain Score 02/16/24 0953 9     Pain Loc --      Pain Education --      Exclude from Growth Chart --    No data found.  Updated Vital Signs BP 115/76 (BP Location: Left Arm)   Pulse (!) 58    Temp 98.3 F (36.8 C) (Oral)   Resp 17   LMP 01/23/2024 (Exact Date)   SpO2 98%   Breastfeeding No    Physical Exam Vitals and nursing note reviewed.  Constitutional:      General: She is not in acute distress.    Appearance: Normal appearance. She is not ill-appearing or toxic-appearing.  HENT:     Head: Normocephalic and atraumatic.  Eyes:     General: No scleral icterus.       Right eye: No discharge.        Left eye: No discharge.     Conjunctiva/sclera: Conjunctivae normal.  Cardiovascular:     Rate and Rhythm: Bradycardia present.     Heart sounds: Normal heart sounds.  Pulmonary:     Effort: Pulmonary effort is normal. No respiratory distress.  Musculoskeletal:     Cervical back: Neck supple.  Skin:    General: Skin is dry.     Comments: RIGHT AXILLA: There is an area of swelling/redness, fluctuance 3 cm x 3 cm. Area is TTP.  Neurological:     General: No focal deficit present.     Mental Status: She is alert. Mental status is at baseline.     Motor: No weakness.     Gait: Gait normal.  Psychiatric:        Mood and Affect: Mood normal.        Behavior: Behavior normal.      UC Treatments / Results  Labs (all labs ordered are listed, but only abnormal results are displayed) Labs Reviewed - No data to display  EKG   Radiology No results found.  Procedures Incision and Drainage  Date/Time: 02/16/2024 10:44 AM  Performed by: Arvis Jolan NOVAK, PA-C Authorized by: Arvis Jolan NOVAK, PA-C   Consent:    Consent obtained:  Verbal   Consent given by:  Patient   Alternatives discussed:  No treatment and delayed treatment Universal protocol:    Patient identity confirmed:  Verbally with patient Location:    Type:  Abscess   Size:  3 cm x 3 cm   Location:  Upper extremity   Upper extremity location:  Arm  Arm location:  R upper arm Pre-procedure details:    Skin preparation:  Povidone-iodine Anesthesia:    Anesthesia method:  Local infiltration    Local anesthetic:  Lidocaine  1% w/o epi Procedure type:    Complexity:  Simple Procedure details:    Incision types:  Single straight   Wound management:  Probed and deloculated   Drainage:  Bloody and purulent   Drainage amount:  Moderate   Wound treatment:  Wound left open Post-procedure details:    Procedure completion:  Tolerated well, no immediate complications  (including critical care time)  Medications Ordered in UC Medications - No data to display  Initial Impression / Assessment and Plan / UC Course  I have reviewed the triage vital signs and the nursing notes.  Pertinent labs & imaging results that were available during my care of the patient were reviewed by me and considered in my medical decision making (see chart for details).   28 y/o female presents for abscess of the right axilla x 1 week. Patient has 3 cm x 3 cm abscess with fluctuance right axilla. Gives consents for I&D. See procedure not. Patient tolerated well. Sent doxycycline . Discussed wound care guidelines. Reviewed return precautions.    Final Clinical Impressions(s) / UC Diagnoses   Final diagnoses:  Abscess of axilla, right     Discharge Instructions      -Change bandage daily and clean area with soap and water.  -May use warm compresses to help area drain more -Take full course of antibiotics -Return is not continuing to improve or if symptoms worsen    ED Prescriptions     Medication Sig Dispense Auth. Provider   doxycycline  (VIBRAMYCIN ) 100 MG capsule Take 1 capsule (100 mg total) by mouth 2 (two) times daily for 7 days. 14 capsule Arvis Jolan NOVAK, PA-C      PDMP not reviewed this encounter.   Arvis Jolan NOVAK, PA-C 02/16/24 1048
